# Patient Record
Sex: Female | Born: 1985 | Race: White | Marital: Married | Smoking: Never smoker
Health system: Southern US, Community
[De-identification: ages and names within clinical notes are randomized; demographics above are authoritative.]

## PROBLEM LIST (undated history)

## (undated) DIAGNOSIS — F32A Depression, unspecified: Secondary | ICD-10-CM

## (undated) DIAGNOSIS — T7840XA Allergy, unspecified, initial encounter: Secondary | ICD-10-CM

## (undated) DIAGNOSIS — R569 Unspecified convulsions: Secondary | ICD-10-CM

## (undated) DIAGNOSIS — J45909 Unspecified asthma, uncomplicated: Secondary | ICD-10-CM

## (undated) DIAGNOSIS — F419 Anxiety disorder, unspecified: Secondary | ICD-10-CM

## (undated) HISTORY — PX: HIP SURGERY: SHX245

## (undated) HISTORY — DX: Anxiety disorder, unspecified: F41.9

## (undated) HISTORY — DX: Unspecified convulsions: R56.9

## (undated) HISTORY — DX: Unspecified asthma, uncomplicated: J45.909

## (undated) HISTORY — PX: OVARIAN CYST REMOVAL: SHX89

## (undated) HISTORY — DX: Depression, unspecified: F32.A

## (undated) HISTORY — DX: Allergy, unspecified, initial encounter: T78.40XA

---

## 2012-09-05 ENCOUNTER — Other Ambulatory Visit: Payer: Self-pay | Admitting: Nurse Practitioner

## 2012-09-05 DIAGNOSIS — N949 Unspecified condition associated with female genital organs and menstrual cycle: Secondary | ICD-10-CM

## 2012-09-05 DIAGNOSIS — R102 Pelvic and perineal pain: Secondary | ICD-10-CM

## 2012-09-08 ENCOUNTER — Ambulatory Visit
Admission: RE | Admit: 2012-09-08 | Discharge: 2012-09-08 | Disposition: A | Discharge status: Home / Self Care | Payer: BC Managed Care – PPO | Source: Ambulatory Visit | Attending: Nurse Practitioner | Admitting: Nurse Practitioner

## 2012-09-08 DIAGNOSIS — N949 Unspecified condition associated with female genital organs and menstrual cycle: Secondary | ICD-10-CM

## 2012-09-08 DIAGNOSIS — R102 Pelvic and perineal pain: Secondary | ICD-10-CM

## 2013-02-10 DIAGNOSIS — R634 Abnormal weight loss: Secondary | ICD-10-CM

## 2013-02-10 HISTORY — DX: Abnormal weight loss: R63.4

## 2014-08-02 DIAGNOSIS — G43901 Migraine, unspecified, not intractable, with status migrainosus: Secondary | ICD-10-CM | POA: Insufficient documentation

## 2014-08-02 DIAGNOSIS — K219 Gastro-esophageal reflux disease without esophagitis: Secondary | ICD-10-CM | POA: Insufficient documentation

## 2015-01-21 DIAGNOSIS — Z79899 Other long term (current) drug therapy: Secondary | ICD-10-CM | POA: Insufficient documentation

## 2015-01-21 HISTORY — DX: Other long term (current) drug therapy: Z79.899

## 2015-05-08 DIAGNOSIS — F325 Major depressive disorder, single episode, in full remission: Secondary | ICD-10-CM | POA: Insufficient documentation

## 2015-05-08 DIAGNOSIS — F40298 Other specified phobia: Secondary | ICD-10-CM | POA: Insufficient documentation

## 2015-05-08 DIAGNOSIS — F411 Generalized anxiety disorder: Secondary | ICD-10-CM

## 2015-05-08 HISTORY — DX: Generalized anxiety disorder: F41.1

## 2015-05-08 HISTORY — DX: Major depressive disorder, single episode, in full remission: F32.5

## 2016-10-06 DIAGNOSIS — Z975 Presence of (intrauterine) contraceptive device: Secondary | ICD-10-CM | POA: Diagnosis not present

## 2016-10-06 DIAGNOSIS — N938 Other specified abnormal uterine and vaginal bleeding: Secondary | ICD-10-CM | POA: Diagnosis not present

## 2016-11-03 DIAGNOSIS — F411 Generalized anxiety disorder: Secondary | ICD-10-CM | POA: Diagnosis not present

## 2016-11-03 DIAGNOSIS — F3341 Major depressive disorder, recurrent, in partial remission: Secondary | ICD-10-CM | POA: Diagnosis not present

## 2016-11-03 DIAGNOSIS — F40298 Other specified phobia: Secondary | ICD-10-CM | POA: Diagnosis not present

## 2016-11-04 DIAGNOSIS — S61213A Laceration without foreign body of left middle finger without damage to nail, initial encounter: Secondary | ICD-10-CM | POA: Diagnosis not present

## 2016-12-02 DIAGNOSIS — Z01419 Encounter for gynecological examination (general) (routine) without abnormal findings: Secondary | ICD-10-CM | POA: Diagnosis not present

## 2016-12-02 DIAGNOSIS — N921 Excessive and frequent menstruation with irregular cycle: Secondary | ICD-10-CM | POA: Diagnosis not present

## 2016-12-02 DIAGNOSIS — Z6825 Body mass index (BMI) 25.0-25.9, adult: Secondary | ICD-10-CM | POA: Diagnosis not present

## 2016-12-15 DIAGNOSIS — F40298 Other specified phobia: Secondary | ICD-10-CM | POA: Diagnosis not present

## 2016-12-15 DIAGNOSIS — F3341 Major depressive disorder, recurrent, in partial remission: Secondary | ICD-10-CM | POA: Diagnosis not present

## 2016-12-15 DIAGNOSIS — F411 Generalized anxiety disorder: Secondary | ICD-10-CM | POA: Diagnosis not present

## 2017-03-15 DIAGNOSIS — S63642A Sprain of metacarpophalangeal joint of left thumb, initial encounter: Secondary | ICD-10-CM | POA: Diagnosis not present

## 2017-03-30 DIAGNOSIS — M25551 Pain in right hip: Secondary | ICD-10-CM | POA: Diagnosis not present

## 2017-04-21 NOTE — Progress Notes (Signed)
[name] [address] [place], [place] [address] Phone: [phone] Subjective:    I'm seeing this patient by the request  of:    CC: Hip pain  HWK:[id number]  Tammy Cunningham is a 31 y.o. female coming in with complaint of hip pain. Right-sided. Patient does do Brother therapy. Fell on the sign. Patient did do a lot of other things that unfortunately continued to have worsening pain. Seems to start on the posterior aspect andaround laterally to bit anterior. Seems to be more on the anterior aspect now.States that she has a discomfort at all times.Sometimes has a mild spasming going down the leg. Denies any associated back pain. Patient attempted to try to roller skate but unfortunately found it to start causing worsening pain.Rates the severity of pain a 6 out of 10 and not improving.Does respond somewhat to anti-inflammatories.    No past medical history on file. No past surgical history on file. Social History   Social History  . Marital status: Single    Spouse name: N/A  . Number of children: N/A  . Years of education: N/A   Social History Main Topics  . Smoking status: Never Smoker  . Smokeless tobacco: Never Used  . Alcohol use None  . Drug use: Unknown  . Sexual activity: Not Asked   Other Topics Concern  . None   Social History Narrative  . None   Allergies not on file No family history on file.   Past medical history, social, surgical and family history all reviewed in electronic medical record.  No pertanent information unless stated regarding to the chief complaint.   Review of Systems:Review of systems updated and as accurate as of 04/22/17  No headache, visual changes, nausea, vomiting, diarrhea, constipation, dizziness, abdominal pain, skin rash, fevers, chills, night sweats, weight loss, swollen lymph nodes, body aches, joint swelling, muscle aches, chest pain, shortness of breath, mood changes.   Objective  Blood pressure  116/82, pulse 74, height 5\' 8"  (1.727 m), weight 136 lb (61.7 kg), SpO2 99 %. Systems examined below as of 04/22/17   General: No apparent distress alert and oriented x3 mood and affect normal, dressed appropriately.  HEENT: Pupils equal, extraocular movements intact  Respiratory: Patient's speak in full sentences and does not appear short of breath  Cardiovascular: No lower extremity edema, non tender, no erythema  Skin: Warm dry intact with no signs of infection or rash on extremities or on axial skeleton.  Abdomen: Soft nontender  Neuro: Cranial nerves II through XII are intact, neurovascularly intact in all extremities with 2+ DTRs and 2+ pulses.  Lymph: No lymphadenopathy of posterior or anterior cervical chain or axillae bilaterally.  Gait normal with good balance and coordination.  MSK:  Non tender with full range of motion and good stability and symmetric strength and tone of shoulders, elbows, wrist, knee and ankles bilaterally.  YVO:PFYTW ROM IR: 25 Deg with worsening pain ER: 45 Deg, Flexion: 120 Deg, Extension: 100 Deg, Abduction: 45 Deg, Adduction: 20 Deg Strength IR: 5/5, ER: 5/5, Flexion: 5/5, Extension: 5/5, Abduction: 5/5, Adduction: 5/5 Pelvic alignment unremarkable to inspection and palpation. Standing hip rotation and gait without trendelenburg sign / unsteadiness. Greater trochanter without tenderness to palpation. No tenderness over piriformis and greater trochanter. Pain with Pearlean Brownie as well as internal rotation but seems to be on the anterior lateral aspect of thehipPain with positive grind test No SI joint tenderness and normal minimal SI movement. Negative straight leg  test but any type offlexion against resistance of the hip causes a spasm of the quadricep.  Procedure note 97110; 15 additional minutes spent for Therapeutic exercises as stated in above notes.  This included exercises focusing on stretching, strengthening, with significant focus on eccentric aspects.    Long term goals include an improvement in range of motion, strength, endurance as well as avoiding reinjury. Patient's frequency would include in 1-2 times a day, 3-5 times a week for a duration of 6-12 weeks. Hip strengthening exercises which included:  Pelvic tilt/bracing to help with proper recruitment of the lower abs and pelvic floor muscles  Glute strengthening to properly contract glutes without over-engaging low back and hamstrings - prone hip extension and glute bridge exercises Proper stretching techniques to increase effectiveness for the hip flexors, groin, quads, piriformic and low back when appropriate     Proper technique shown and discussed handout in great detail with ATC.  All questions were discussed and answered.     Impression and Recommendations:     This case required medical decision making of moderate complexity.      Note: This dictation was prepared with Dragon dictation along with smaller phrase technology. Any transcriptional errors that result from this process are unintentional.

## 2017-04-22 ENCOUNTER — Ambulatory Visit (INDEPENDENT_AMBULATORY_CARE_PROVIDER_SITE_OTHER)
Admission: RE | Admit: 2017-04-22 | Discharge: 2017-04-22 | Disposition: A | Discharge status: Home / Self Care | Payer: BLUE CROSS/BLUE SHIELD | Source: Ambulatory Visit | Attending: Family Medicine | Admitting: Family Medicine

## 2017-04-22 ENCOUNTER — Ambulatory Visit (INDEPENDENT_AMBULATORY_CARE_PROVIDER_SITE_OTHER): Payer: BLUE CROSS/BLUE SHIELD | Admitting: Family Medicine

## 2017-04-22 ENCOUNTER — Encounter: Payer: Self-pay | Admitting: Family Medicine

## 2017-04-22 VITALS — BP 116/82 | HR 74 | Ht 68.0 in | Wt 136.0 lb

## 2017-04-22 DIAGNOSIS — M25551 Pain in right hip: Secondary | ICD-10-CM | POA: Diagnosis not present

## 2017-04-22 DIAGNOSIS — S79911A Unspecified injury of right hip, initial encounter: Secondary | ICD-10-CM | POA: Diagnosis not present

## 2017-04-22 MED ORDER — VITAMIN D (ERGOCALCIFEROL) 1.25 MG (50000 UNIT) PO CAPS: 50000.0000 [IU] | ORAL_CAPSULE | ORAL | 0 refills | Status: DC

## 2017-04-22 MED ORDER — MELOXICAM 15 MG PO TABS: 15.0000 mg | ORAL_TABLET | Freq: Every day | ORAL | 0 refills | Status: DC

## 2017-04-22 NOTE — Assessment & Plan Note (Signed)
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 This could be secondary to his labral pathology potential loose body. Patient is able to move but does have difficulty.Patient hasx-rays ordered today, meloxicam for breakthrough pain, once weekly vitamin D to help with the differential a possible stress fracture. Encourage patient to limit certain activities such as contact. Patient given home exercises. Follow-up againin 3-4 weeks.

## 2017-04-22 NOTE — Patient Instructions (Addendum)
Good to see you  Ice 20 minutes 2 times daily. Usually after activity and before bed. Exercises 3 times a week.  Thigh compression sleeve daily for 2 weeks No contact for 2 weeks.  once weekly vitamin D for 12 weeks.  Meloxicam daily for 10 days then as needed See me again in 3 weeks.

## 2017-05-06 ENCOUNTER — Ambulatory Visit (INDEPENDENT_AMBULATORY_CARE_PROVIDER_SITE_OTHER): Payer: BLUE CROSS/BLUE SHIELD | Admitting: Family Medicine

## 2017-05-06 ENCOUNTER — Encounter: Payer: Self-pay | Admitting: Family Medicine

## 2017-05-06 DIAGNOSIS — M25551 Pain in right hip: Secondary | ICD-10-CM | POA: Diagnosis not present

## 2017-05-06 MED ORDER — NAPROXEN 500 MG PO TABS: 500.0000 mg | ORAL_TABLET | Freq: Two times a day (BID) | ORAL | 3 refills | Status: AC

## 2017-05-06 NOTE — Progress Notes (Signed)
  [facility] Marybella [facility] D.[facility] [address] [place], [place] [address] Phone: [phone] Subjective:    I'm seeing this patient by the request  of:    CC: Hip pain f/u  FAO:[id number]:Subjective  Tammy JacobKatie Cunningham is a 31 y.o. female coming in with complaint of hip pain. Right-sided. .Labral tear. Length in trying conservative therapy. Patient states unfortunately having worsening pain. States that pain is worse than at night. Having increasing instability. Did have x-rays at last follow-up that were completely unremarkable. These were evaluated by me and independently reviewed by me. Patient states though that unable to do many activities. Seems to be worse when she loads the hip.  No past medical history on file. No past surgical history on file. Social History   Social History  . Marital status: Single    Spouse name: N/A  . Number of children: N/A  . Years of education: N/A   Social History Main Topics  . Smoking status: Never Smoker  . Smokeless tobacco: Never Used  . Alcohol use None  . Drug use: Unknown  . Sexual activity: Not Asked   Other Topics Concern  . None   Social History Narrative  . None   Not on File No family history on file. No family history of autoimmune diseases   Past medical history, social, surgical and family history all reviewed in electronic medical record.  No pertanent information unless stated regarding to the chief complaint.   R.zsys   Objective  Blood pressure 118/80, pulse 78, height 5\' 8"  (1.727 m), weight 146 lb (66.2 kg), SpO2 97 %.   Systems examined below as of 05/06/17 General: NAD A&O x3 mood, affect normal  HEENT: Pupils equal, extraocular movements intact no nystagmus Respiratory: not short of breath at rest or with speaking Cardiovascular: No lower extremity edema, non tender Skin: Warm dry intact with no signs of infection or rash on extremities or on axial skeleton. Abdomen: Soft nontender, no masses Neuro: Cranial  nerves  intact, neurovascularly intact in all extremities with 2+ DTRs and 2+ pulses. Lymph: No lymphadenopathy appreciated today  Gait normal with good balance and coordination.  MSK: Non tender with full range of motion and good stability and symmetric strength and tone of shoulders, elbows, wrist,  knee and ankles bilaterally.     Hip: Right ROM IR: 15 Deg with worsening pain, ER: 45 Deg, Flexion: 120 Deg, Extension: 100 Deg, Abduction: 45 Deg, Adduction: 5 Deg with worsening pain Strength IR: 5/5, ER: 5/5, Flexion: 5/5, Extension: 5/5, Abduction: 4/5, Adduction: 4/5 Pelvic alignment unremarkable to inspection and palpation. Standing hip rotation and gait without trendelenburg sign / unsteadiness. Worsening pain with internal rotation. Nontender over the greater trochanteric area of No SI joint tenderness and normal minimal SI movement. Contralateral hip unremarkable    Impression and Recommendations:     This case required medical decision making of moderate complexity.      Note: This dictation was prepared with Dragon dictation along with smaller phrase technology. Any transcriptional errors that result from this process are unintentional.

## 2017-05-06 NOTE — Assessment & Plan Note (Signed)
Patient has had this pain for quite some time. Worsening symptoms Positive grind test and positive pain with internal rotation. Patient is having is starting to limit that even daily activities. Limited her job as well. Failed all conservative therapy and I do feel that advance imaging is warranted. Labral pathology is a possibility MR angiogram will be ordered today. Depending on findings this could change medical management significantly. Patient is in agreement with plan.

## 2017-05-06 NOTE — Patient Instructions (Addendum)
Good to see you  Tammy Cunningham is your friend.  Naproxen 2 times a day instead Continue the vitamin D We will get the MRI.  I will write you and discuss next steps

## 2017-05-07 ENCOUNTER — Encounter: Payer: Self-pay | Admitting: Family Medicine

## 2017-05-07 ENCOUNTER — Other Ambulatory Visit: Payer: Self-pay

## 2017-05-07 DIAGNOSIS — M25551 Pain in right hip: Secondary | ICD-10-CM

## 2017-05-07 MED ORDER — PREDNISONE 50 MG PO TABS: 50.0000 mg | ORAL_TABLET | Freq: Every day | ORAL | 0 refills | Status: DC

## 2017-05-13 ENCOUNTER — Ambulatory Visit: Payer: BLUE CROSS/BLUE SHIELD | Admitting: Family Medicine

## 2017-05-18 ENCOUNTER — Ambulatory Visit
Admission: RE | Admit: 2017-05-18 | Discharge: 2017-05-18 | Disposition: A | Discharge status: Home / Self Care | Payer: BLUE CROSS/BLUE SHIELD | Source: Ambulatory Visit | Attending: Family Medicine | Admitting: Family Medicine

## 2017-05-18 DIAGNOSIS — M25551 Pain in right hip: Secondary | ICD-10-CM

## 2017-05-18 DIAGNOSIS — S73191A Other sprain of right hip, initial encounter: Secondary | ICD-10-CM | POA: Diagnosis not present

## 2017-05-18 MED ORDER — IOPAMIDOL (ISOVUE-M 200) INJECTION 41%
12.0000 mL | Freq: Once | INTRAMUSCULAR | Status: AC
  Administered 2017-05-18: 12 mL via INTRA_ARTICULAR

## 2017-05-19 ENCOUNTER — Encounter: Payer: Self-pay | Admitting: Family Medicine

## 2017-05-25 ENCOUNTER — Ambulatory Visit: Payer: Self-pay

## 2017-05-25 ENCOUNTER — Encounter: Payer: Self-pay | Admitting: Family Medicine

## 2017-05-25 ENCOUNTER — Ambulatory Visit (INDEPENDENT_AMBULATORY_CARE_PROVIDER_SITE_OTHER): Payer: BLUE CROSS/BLUE SHIELD | Admitting: Family Medicine

## 2017-05-25 VITALS — BP 100/70 | HR 76 | Ht 67.0 in | Wt 150.0 lb

## 2017-05-25 DIAGNOSIS — M25551 Pain in right hip: Secondary | ICD-10-CM | POA: Diagnosis not present

## 2017-05-25 DIAGNOSIS — S73191A Other sprain of right hip, initial encounter: Secondary | ICD-10-CM

## 2017-05-25 NOTE — Progress Notes (Signed)
[name] [address] [place], [place] [address] Phone: [phone] Subjective:     CC: Right hip pain  WGN:[id number]  Tammy Cunningham is a 31 y.o. female coming in with complaint of right hip pain. Was having pain and was seeming to be intra-articular. Sent for a MRI arthrogram. Independently visualized by me showing a labral tear. Patient is going to likely have surgical intervention but wants to consider in 2-3 months. Pain is unrelenting at that time and is starting to stop her from daily activities. Looking for some other type of intervention at this time to help her to continue at least been able to work     No past medical history on file. No past surgical history on file. Social History   Social History  . Marital status: Single    Spouse name: N/A  . Number of children: N/A  . Years of education: N/A   Social History Main Topics  . Smoking status: Never Smoker  . Smokeless tobacco: Never Used  . Alcohol use Not on file  . Drug use: Unknown  . Sexual activity: Not on file   Other Topics Concern  . Not on file   Social History Narrative  . No narrative on file   Allergies  Allergen Reactions  . Peanut Oil Anaphylaxis  . Amoxicillin Hives  . Depakote Er  [Divalproex Sodium Er] Other (See Comments)    Liver failure  . Lac Bovis Other (See Comments)    Breathing problems  . Onion Other (See Comments)    breathing  . Paroxetine Other (See Comments)    nausea  . Penicillins Hives  . Eggs Or Egg-Derived Products Other (See Comments)   No family history on file.   Past medical history, social, surgical and family history all reviewed in electronic medical record.  No pertanent information unless stated regarding to the chief complaint.   Review of Systems:Review of systems updated and as accurate as of 05/25/17  No headache, visual changes, nausea, vomiting, diarrhea, constipation, dizziness, abdominal pain, skin rash, fevers,  chills, night sweats, weight loss, swollen lymph nodes, body aches, joint swelling, muscle aches, chest pain, shortness of breath, mood changes.   Objective  Blood pressure (!) 124/56. Systems examined below as of 05/25/17   General: No apparent distress alert and oriented x3 mood and affect normal, dressed appropriately.  HEENT: Pupils equal, extraocular movements intact  Respiratory: Patient's speak in full sentences and does not appear short of breath  Cardiovascular: No lower extremity edema, non tender, no erythema  Skin: Warm dry intact with no signs of infection or rash on extremities or on axial skeleton.  Abdomen: Soft nontender  Neuro: Cranial nerves II through XII are intact, neurovascularly intact in all extremities with 2+ DTRs and 2+ pulses.  Lymph: No lymphadenopathy of posterior or anterior cervical chain or axillae bilaterally.  Gait normal with good balance and coordination.  MSK:  Non tender with full range of motion and good stability and symmetric strength and tone of shoulders, elbows, wrist, knee and ankles bilaterally.   Hip: Right ROM IR: 25 Deg pain with internal range of motion, ER: 45 Deg, Flexion: 120 Deg, Extension: 100 Deg, Abduction: 45 Deg, Adduction: 45 Deg Strength IR: 5/5, ER: 5/5, Flexion: 5/5, Extension: 5/5, Abduction: 5/5, Adduction: 5/5 Pelvic alignment unremarkable to inspection and palpation. Standing hip rotation and gait without trendelenburg sign / unsteadiness. Greater trochanter without tenderness to palpation. No tenderness over piriformis and  greater trochanter. Positive pain with flexion, adduction, and internal rotation of the hip No SI joint tenderness and normal minimal SI movement.    Procedure: Real-time Ultrasound Guided Injection of right hip Device: GE Logiq Q7  Ultrasound guided injection is preferred based studies that show increased duration, increased effect, greater accuracy, decreased procedural pain, increased response  rate with ultrasound guided versus blind injection.  Verbal informed consent obtained.  Time-out conducted.  Noted no overlying erythema, induration, or other signs of local infection.  Skin prepped in a sterile fashion.  Local anesthesia: Topical Ethyl chloride.  With sterile technique and under real time ultrasound guidance:  Anterior capsule visualized, needle visualized going to the head neck junction at the anterior capsule. Pictures taken. Patient did have injection of  3 cc of 0.5% Marcaine, and 1 cc of Kenalog 40 mg/dL. Completed without difficulty  Pain immediately resolved suggesting accurate placement of the medication.  Advised to call if fevers/chills, erythema, induration, drainage, or persistent bleeding.  Images permanently stored and available for review in the ultrasound unit.  Impression: Technically successful ultrasound guided injection. Impression and Recommendations:     This case required medical decision making of moderate complexity.      Note: This dictation was prepared with Dragon dictation along with smaller phrase technology. Any transcriptional errors that result from this process are unintentional.

## 2017-05-25 NOTE — Assessment & Plan Note (Signed)
Patient was given an injection. This will hopefully help for a while and will also be diagnostic. Patient will be referred to orthopedic surgery to discuss the possibility of surgical intervention for the labral tear. Asian has different medications for breakthrough pain needed. Patient call if any worsening symptoms. Patient will follow-up with me again in 4-8 weeks otherwise. Continue home exercises. Avoid high impact exercises.

## 2017-05-25 NOTE — Patient Instructions (Signed)
Good to see you  Ice is your friend We will get you in with the surgeon soon.  Call me if you need me

## 2017-05-26 ENCOUNTER — Encounter: Payer: Self-pay | Admitting: Family Medicine

## 2017-07-27 ENCOUNTER — Encounter: Payer: Self-pay | Admitting: Family Medicine

## 2017-07-28 ENCOUNTER — Encounter: Payer: Self-pay | Admitting: Family Medicine

## 2017-08-18 NOTE — Progress Notes (Signed)
Tammy Cunningham, [place] [id number] Phone: [phone] Subjective:     CC: Right hip pain follow-up  BJY:[id number]:Subjective  Tammy Cunningham is a 31 y.o. female coming in with complaint of right hip pain follow-up.  Patient has a known labral tear.  Patient is scheduled for surgery next month.  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  Has responded well to the injection previously.  Looking to potentially have another one so she can get better before her surgery.     History reviewed. No pertinent past medical history. History reviewed. No pertinent surgical history. Social History   Socioeconomic History  . Marital status: Single    Spouse name: None  . Number of children: None  . Years of education: None  . Highest education level: None  Social Needs  . Financial resource strain: None  . Food insecurity - worry: None  . Food insecurity - inability: None  . Transportation needs - medical: None  . Transportation needs - non-medical: None  Occupational History  . None  Tobacco Use  . Smoking status: Never Smoker  . Smokeless tobacco: Never Used  Substance and Sexual Activity  . Alcohol use: None  . Drug use: None  . Sexual activity: None  Other Topics Concern  . None  Social History Narrative  . None   Allergies  Allergen Reactions  . Peanut Oil Anaphylaxis  . Amoxicillin Hives  . Depakote Er  [Divalproex Sodium Er] Other (See Comments)    Liver failure  . Lac Bovis Other (See Comments)    Breathing problems  . Onion Other (See Comments)    breathing  . Paroxetine Other (See Comments)    nausea  . Penicillins Hives  . Eggs Or Egg-Derived Products Other (See Comments)   History reviewed. No pertinent family history.   Past medical history, social, surgical and family history all reviewed in electronic medical record.  No pertanent information unless stated regarding to the chief complaint.    Review of Systems:Review of systems updated and as accurate as of 08/19/17  No headache, visual changes, nausea, vomiting, diarrhea, constipation, dizziness, abdominal pain, skin rash, fevers, chills, night sweats, weight loss, swollen lymph nodes, body aches, joint swelling, chest pain, shortness of breath, mood changes.  Positive muscle aches  Objective  Blood pressure 110/80, pulse 79, height 5\' 7"  (1.702 m), weight 150 lb (68 kg), SpO2 98 %. Systems examined below as of 08/19/17   General: No apparent distress alert and oriented x3 mood and affect normal, dressed appropriately.  HEENT: Pupils equal, extraocular movements intact  Respiratory: Patient's speak in full sentences and does not appear short of breath  Cardiovascular: No lower extremity edema, non tender, no erythema  Skin: Warm dry intact with no signs of infection or rash on extremities or on axial skeleton.  Abdomen: Soft nontender  Neuro: Cranial nerves II through XII are intact, neurovascularly intact in all extremities with 2+ DTRs and 2+ pulses.  Lymph: No lymphadenopathy of posterior or anterior cervical chain or axillae bilaterally.  Gait normal with good balance and coordination.  MSK:  Non tender with full range of motion and good stability and symmetric strength and tone of shoulders, elbows, wrist, knee and ankles bilaterally.  Hip: Right ROM IR: 15 Deg increased pain, ER: 45 Deg, Flexion: 120 Deg, Extension: 100 Deg, Abduction: 45 Deg, Adduction: 45 Deg Strength IR: 5/5, ER: 5/5, Flexion: 5/5, Extension:  5/5, Abduction: 5/5, Adduction: 5/5 Pelvic alignment unremarkable to inspection and palpation. Standing hip rotation and gait without trendelenburg sign / unsteadiness. Greater trochanter without tenderness to palpation. No tenderness over piriformis and greater trochanter. Positive Faber and increased pain with internal rotation No SI joint tenderness and normal minimal SI movement. Contralateral hip  unremarkable  Procedure: Real-time Ultrasound Guided Injection of right hip Device: GE Logiq Q7  Ultrasound guided injection is preferred based studies that show increased duration, increased effect, greater accuracy, decreased procedural pain, increased response rate with ultrasound guided versus blind injection.  Verbal informed consent obtained.  Time-out conducted.  Noted no overlying erythema, induration, or other signs of local infection.  Skin prepped in a sterile fashion.  Local anesthesia: Topical Ethyl chloride.  With sterile technique and under real time ultrasound guidance:  Anterior capsule visualized, needle visualized going to the head neck junction at the anterior capsule. Pictures taken. Patient did have injection of  3 cc of 0.5% Marcaine, and 1 cc of Kenalog 40 mg/dL. Completed without difficulty  Pain immediately resolved suggesting accurate placement of the medication.  Advised to call if fevers/chills, erythema, induration, drainage, or persistent bleeding.  Images permanently stored and available for review in the ultrasound unit.  Impression: Technically successful ultrasound guided injection.   Impression and Recommendations:     This case required medical decision making of moderate complexity.      Note: This dictation was prepared with Dragon dictation along with smaller phrase technology. Any transcriptional errors that result from this process are unintentional.

## 2017-08-19 ENCOUNTER — Encounter: Payer: Self-pay | Admitting: Family Medicine

## 2017-08-19 ENCOUNTER — Ambulatory Visit: Payer: BC Managed Care – PPO | Admitting: Family Medicine

## 2017-08-19 ENCOUNTER — Ambulatory Visit: Payer: Self-pay

## 2017-08-19 VITALS — BP 110/80 | HR 79 | Ht 67.0 in | Wt 150.0 lb

## 2017-08-19 DIAGNOSIS — S73191D Other sprain of right hip, subsequent encounter: Secondary | ICD-10-CM | POA: Diagnosis not present

## 2017-08-19 DIAGNOSIS — M25559 Pain in unspecified hip: Secondary | ICD-10-CM

## 2017-08-19 NOTE — Patient Instructions (Signed)
Happy holidays!  Ice may be needed in 6 hours.  Have a great new year and you will do great with surgery  Keep me updated  I am here if you have questions.

## 2017-08-19 NOTE — Assessment & Plan Note (Signed)
Worsening pain again.  Patient is scheduled for surgery in the near future.  Given injection to help her until that date.  We discussed icing regimen, home exercises, patient has some pain management with over-the-counter medications as well.  Follow-up again as needed.

## 2017-09-14 ENCOUNTER — Encounter: Payer: Self-pay | Admitting: Family Medicine

## 2019-09-05 DIAGNOSIS — Z91010 Allergy to peanuts: Secondary | ICD-10-CM | POA: Insufficient documentation

## 2019-11-30 DIAGNOSIS — F988 Other specified behavioral and emotional disorders with onset usually occurring in childhood and adolescence: Secondary | ICD-10-CM | POA: Insufficient documentation

## 2020-09-05 DIAGNOSIS — Z13220 Encounter for screening for lipoid disorders: Secondary | ICD-10-CM | POA: Insufficient documentation

## 2020-09-05 DIAGNOSIS — Z7689 Persons encountering health services in other specified circumstances: Secondary | ICD-10-CM | POA: Insufficient documentation

## 2020-09-05 HISTORY — DX: Encounter for screening for lipoid disorders: Z13.220

## 2021-05-27 DIAGNOSIS — M542 Cervicalgia: Secondary | ICD-10-CM

## 2021-05-27 HISTORY — DX: Cervicalgia: M54.2

## 2021-06-11 ENCOUNTER — Other Ambulatory Visit: Payer: Self-pay

## 2021-06-11 ENCOUNTER — Encounter: Payer: Self-pay | Admitting: Family Medicine

## 2021-06-11 ENCOUNTER — Telehealth: Payer: Self-pay | Admitting: Family Medicine

## 2021-06-11 ENCOUNTER — Ambulatory Visit (INDEPENDENT_AMBULATORY_CARE_PROVIDER_SITE_OTHER): Payer: 59 | Admitting: Family Medicine

## 2021-06-11 ENCOUNTER — Ambulatory Visit: Payer: Self-pay

## 2021-06-11 ENCOUNTER — Ambulatory Visit (INDEPENDENT_AMBULATORY_CARE_PROVIDER_SITE_OTHER): Payer: 59

## 2021-06-11 VITALS — BP 110/70 | HR 90 | Ht 67.0 in | Wt 148.0 lb

## 2021-06-11 DIAGNOSIS — S73191D Other sprain of right hip, subsequent encounter: Secondary | ICD-10-CM

## 2021-06-11 DIAGNOSIS — M25559 Pain in unspecified hip: Secondary | ICD-10-CM

## 2021-06-11 DIAGNOSIS — M25551 Pain in right hip: Secondary | ICD-10-CM

## 2021-06-11 MED ORDER — MELOXICAM 15 MG PO TABS: 15.0000 mg | ORAL_TABLET | Freq: Every day | ORAL | 0 refills | Status: DC

## 2021-06-11 MED ORDER — TIZANIDINE HCL 2 MG PO TABS: 2.0000 mg | ORAL_TABLET | Freq: Every day | ORAL | 0 refills | Status: DC

## 2021-06-11 NOTE — Progress Notes (Signed)
[name] [address] [place] [address] Phone: [phone] Subjective:   INadine Counts, am serving as a scribe for Dr. Antoine Primas. This visit occurred during the SARS-CoV-2 public health emergency.  Safety protocols were in place, including screening questions prior to the visit, additional usage of staff PPE, and extensive cleaning of exam room while observing appropriate contact time as indicated for disinfecting solutions.   I'm seeing this patient by the request  of:  Tammy Baldy, NP  CC: Hip pain  [id number]  Seen Dec 2018 for right hip pain  Worsening pain again.  Patient is scheduled for surgery in the near future.  Given injection to help her until that date.  We discussed icing regimen, home exercises, patient has some pain management with over-the-counter medications as well.  Follow-up again as needed.  Updated 06/11/2021 Tammy Cunningham is a 35 y.o. female coming in with complaint of hip pain. Monday she was pushing some bleachers and felt a sharp pain in her right hip. She said it was a similar pain and has similar pains in that hip when she tore her labrum years ago. She does feel a clicking at the end of every stride and has some low back tightness       No past medical history on file. No past surgical history on file. Social History   Socioeconomic History   Marital status: Single    Spouse name: Not on file   Number of children: Not on file   Years of education: Not on file   Highest education level: Not on file  Occupational History   Not on file  Tobacco Use   Smoking status: Never   Smokeless tobacco: Never  Substance and Sexual Activity   Alcohol use: Not on file   Drug use: Not on file   Sexual activity: Not on file  Other Topics Concern   Not on file  Social History Narrative   Not on file   Social Determinants of Health   Financial Resource Strain: Not on file  Food Insecurity: Not on file   Transportation Needs: Not on file  Physical Activity: Not on file  Stress: Not on file  Social Connections: Not on file   Allergies  Allergen Reactions   Peanut Oil Anaphylaxis   Amoxicillin Hives   Depakote Er  [Divalproex Sodium Er] Other (See Comments)    Liver failure   Lac Bovis Other (See Comments)    Breathing problems   Onion Other (See Comments)    breathing   Paroxetine Other (See Comments)    nausea   Penicillins Hives   Eggs Or Egg-Derived Products Other (See Comments)   No family history on file.  Current Outpatient Medications (Endocrine & Metabolic):    etonogestrel (NEXPLANON) 68 MG IMPL implant, Inject into the skin.   predniSONE (DELTASONE) 50 MG tablet, Take 1 tablet (50 mg total) by mouth daily.    Current Outpatient Medications (Analgesics):    meloxicam (MOBIC) 15 MG tablet, Take 1 tablet (15 mg total) by mouth daily.   ibuprofen (ADVIL,MOTRIN) 200 MG tablet, Take by mouth.   rizatriptan (MAXALT) 10 MG tablet, Take 10 mg by mouth as needed for migraine. May repeat in 2 hours if needed   Current Outpatient Medications (Other):    tiZANidine (ZANAFLEX) 2 MG tablet, Take 1 tablet (2 mg total) by mouth at bedtime.   ALPRAZolam (XANAX) 0.5 MG tablet, take 1 tablet by mouth  once daily if needed for EXTREME ANXIETY   lamoTRIgine (LAMICTAL) 100 MG tablet, take 1 tablet by mouth twice a day   Vitamin D, Ergocalciferol, (DRISDOL) 50000 units CAPS capsule, Take 1 capsule (50,000 Units total) by mouth every 7 (seven) days.   Reviewed prior external information including notes and imaging from  primary care provider As well as notes that were available from care everywhere and other healthcare systems.  Past medical history, social, surgical and family history all reviewed in electronic medical record.  No pertanent information unless stated regarding to the chief complaint.   Review of Systems:  No headache, visual changes, nausea, vomiting, diarrhea,  constipation, dizziness, abdominal pain, skin rash, fevers, chills, night sweats, weight loss, swollen lymph nodes, body aches, joint swelling, chest pain, shortness of breath, mood changes. POSITIVE muscle aches  Objective  Blood pressure 110/70, pulse 90, height 5\' 7"  (1.702 m), weight 148 lb (67.1 kg), SpO2 (!) 89 %.   General: No apparent distress alert and oriented x3 mood and affect normal, dressed appropriately.  HEENT: Pupils equal, extraocular movements intact  Respiratory: Patient's speak in full sentences and does not appear short of breath  Cardiovascular: No lower extremity edema, non tender, no erythema  Gait mild antalgic Right hip exam shows the patient does have some decrease in internal range of motion.  Severe positive grind test noted.  Patient has difficulty even with straight leg on the right side against resistance.  Mild pain in the lower back as well but very minimal.  Negative fulcrum test.  Limited muscular skeletal ultrasound was performed and interpreted by , M  Limited ultrasound shows the patient does have a trace effusion noted of the hip joint itself.  Patient does have potential postsurgical changes worse calcific changes of the anterior labrum that can be appreciated.  No cortical irregularity of the proximal femur or the femoral head Impression: Small effusion of the right    Impression and Recommendations:     The above documentation has been reviewed and is accurate and complete Antoine Primas, DO

## 2021-06-11 NOTE — Assessment & Plan Note (Signed)
Patient is having recurrent right hip pain.  4 years ago patient did have a labral tear.  Patient feels like it is very consistent with this again.  Patient does have significant positive impingement noted with a positive grind test noted.  On ultrasound does have inflammation.  Antalgic gait noted.  Affecting daily activities and waking up at night.  We will get x-rays at the moment but secondary to the feeling of instability I do feel that advanced imaging is warranted.  This is potentially affecting even her employment.  Follow-up with me again after imaging to discuss further.

## 2021-06-11 NOTE — Telephone Encounter (Signed)
Order that was put in yesterday for the Hip MRI should be a Hip Arthogram. Can you re-enter this order please?

## 2021-06-11 NOTE — Patient Instructions (Addendum)
Xray today Meloxicam 15mg  daily, no other anti-inflammatory medicines Zanaflex at night [facility] [phone] Call Today  When we receive your results we will contact you.

## 2021-06-12 NOTE — Addendum Note (Signed)
Addended by: Ethlyn Daniels on: 06/12/2021 09:36 AM   Modules accepted: Orders

## 2021-06-12 NOTE — Telephone Encounter (Signed)
Corrected

## 2021-06-26 ENCOUNTER — Other Ambulatory Visit: Payer: BC Managed Care – PPO

## 2021-07-08 ENCOUNTER — Ambulatory Visit
Admission: RE | Admit: 2021-07-08 | Discharge: 2021-07-08 | Disposition: A | Discharge status: Home / Self Care | Payer: 59 | Source: Ambulatory Visit | Attending: Family Medicine | Admitting: Family Medicine

## 2021-07-08 DIAGNOSIS — M25551 Pain in right hip: Secondary | ICD-10-CM

## 2021-07-08 DIAGNOSIS — S73191D Other sprain of right hip, subsequent encounter: Secondary | ICD-10-CM

## 2021-07-08 MED ORDER — IOPAMIDOL (ISOVUE-M 200) INJECTION 41%
15.0000 mL | Freq: Once | INTRAMUSCULAR | Status: AC
  Administered 2021-07-08: 15 mL via INTRA_ARTICULAR

## 2021-07-10 ENCOUNTER — Encounter: Payer: Self-pay | Admitting: Family Medicine

## 2021-07-15 ENCOUNTER — Other Ambulatory Visit: Payer: Self-pay

## 2021-07-15 DIAGNOSIS — S73191D Other sprain of right hip, subsequent encounter: Secondary | ICD-10-CM

## 2021-07-15 MED ORDER — TIZANIDINE HCL 2 MG PO TABS: 2.0000 mg | ORAL_TABLET | Freq: Every day | ORAL | 0 refills | Status: DC

## 2021-07-15 MED ORDER — MELOXICAM 15 MG PO TABS: 15.0000 mg | ORAL_TABLET | Freq: Every day | ORAL | 0 refills | Status: DC

## 2021-07-17 ENCOUNTER — Other Ambulatory Visit: Payer: Self-pay

## 2021-07-17 DIAGNOSIS — S73191D Other sprain of right hip, subsequent encounter: Secondary | ICD-10-CM

## 2021-07-22 ENCOUNTER — Ambulatory Visit: Payer: 59 | Attending: Family Medicine

## 2021-07-22 ENCOUNTER — Other Ambulatory Visit: Payer: Self-pay

## 2021-07-22 DIAGNOSIS — M6281 Muscle weakness (generalized): Secondary | ICD-10-CM | POA: Diagnosis present

## 2021-07-22 DIAGNOSIS — R262 Difficulty in walking, not elsewhere classified: Secondary | ICD-10-CM | POA: Insufficient documentation

## 2021-07-22 DIAGNOSIS — S73191D Other sprain of right hip, subsequent encounter: Secondary | ICD-10-CM | POA: Diagnosis present

## 2021-07-23 NOTE — Therapy (Signed)
[facility] [address] [place], [place], [address] Phone: [phone]   Fax:  [phone]  Physical Therapy Evaluation  Patient Details  Name: Ryanna Teschner MRN: [id number] Date of Birth: 04-03-1986 Referring Provider (PT): Judi Saa, DO   Encounter Date: 07/22/2021   PT End of Session - 07/22/21 0832     Visit Number 1    Number of Visits 7    Date for PT Re-Evaluation 09/13/21    Authorization Type [employer]    PT Start Time [phone]    PT Stop Time 0800    PT Time Calculation (min) 43 min    Activity Tolerance Patient tolerated treatment well    Behavior During Therapy [facility] for tasks assessed/performed             History reviewed. No pertinent past medical history.  History reviewed. No pertinent surgical history.  There were no vitals filed for this visit.    Subjective Assessment - 07/22/21 0726     Subjective Pt reports having a R hip labral tear 4 years ago, and tore it again approx 8 weeks ago pushing bleachers in at a recreation center. Pt notes having sharp pain with certain movements, and an ache with prolonged activity on feet. 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 Pt has a orthopedic appt for surgicak assessment in Jan 2023    Pertinent History Prior R labral 4 years ago    How long can you sit comfortably? Not an issue    How long can you stand comfortably? 5 mins, shifts wt off/on    How long can you walk comfortably? After 15 mins of walking it is sore when she stops    Diagnostic tests MRI- 07/09/21: IMPRESSION:  1. New right anterior labral tear.    Currently in Pain? Yes    Pain Score 3    range 0-6/10   Pain Location Hip    Pain Orientation Right    Pain Descriptors / Indicators Sharp;Aching    Pain Type Chronic pain    Pain Onset More than a month ago    Pain Frequency Intermittent    Aggravating Factors  certain movoements. standing, prolonged walking    Pain Relieving Factors rest                 [facility] PT Assessment - 07/23/21 0001       Assessment   Medical Diagnosis Tear of right acetabular labrum, subsequent encounter    Referring Provider (PT) Judi Saa, DO    Onset Date/Surgical Date --   approx 8 weeks ago   Next MD Visit Judi Saa, DO    Prior Therapy no      Precautions   Precautions None      Restrictions   Weight Bearing Restrictions Yes      Balance Screen   Has the patient fallen in the past 6 months Yes   playing roller derby   How many times? several    Has the patient had a decrease in activity level because of a fear of falling?  No    Is the patient reluctant to leave their home because of a fear of falling?  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      Cognition   Overall Cognitive Status Within Functional Limits for tasks assessed      Observation/Other Assessments   Focus on Therapeutic Outcomes (FOTO)  Complete on 2nd visit      Sensation   Light Touch Appears Intact      Posture/Postural Control   Posture/Postural Control No significant limitations      ROM / Strength   AROM / PROM / Strength PROM;Strength      PROM   Overall PROM Comments R hip PROM was equal to the L with pain reproduced at the endrange of hip Fadir movement      Strength   Strength Assessment Site Hip;Knee    Right/Left Hip Right;Left    Right Hip Flexion 4-/5    Right Hip Extension 4/5    Right Hip External Rotation  4+/5    Right Hip Internal Rotation 4+/5    Right Hip ABduction 4+/5    Right Hip ADduction 4+/5    Left Hip Flexion 5/5    Left Hip Extension 5/5    Left Hip External Rotation 5/5    Left Hip Internal Rotation 5/5    Left Hip ABduction 5/5    Left Hip ADduction 5/5     Right/Left Knee Right;Left    Right Knee Flexion 4+/5    Right Knee Extension 4+/5    Left Knee Flexion 5/5    Left Knee Extension 5/5      Special Tests    Special Tests Hip Special Tests    Other special tests Fadir R psotivie    Hip Special Tests  Hip Scouring      Hip Scouring   Findings Negative    Side Right      Transfers   Transfers Sit to Stand;Stand to Sit    Sit to Stand 7: Independent      Ambulation/Gait   Ambulation/Gait Yes    Ambulation/Gait Assistance 7: Independent    Gait Pattern Within Functional Limits;Step-through pattern   for distance less than 223ft                       Objective measurements completed on examination: See above findings.                PT Education - 07/22/21 0831     Education Details Eval findings, POC, HEP, positioning in sitting and sleep for comfort    Person(s) Educated Patient    Methods Explanation;Demonstration;Tactile cues;Verbal cues;Handout    Comprehension Verbalized understanding;Returned demonstration;Verbal cues required;Tactile cues required              PT Short Term Goals - 07/23/21 0744       PT SHORT TERM GOAL #1   Title STG=LTG               PT Long Term Goals - 07/23/21 0744       PT LONG TERM GOAL #1   Title Pt will be Ind in a HEP to maintain achieved LOF    Status New    Target Date 09/13/21      PT LONG TERM GOAL #2   Title Increase R hip strength and knee strength to 4+ to 5/5 and 5/5 respectively for improved functional movility both in quality and tolerance    Baseline see flow sheets    Status New    Target Date 09/13/21  PT LONG TERM GOAL #3   Title Increase pt's standing and walking tolerance to 45 mins each for improved functional mobility both in quality and tolerance.    Baseline Standing 5 mins and walking 15 mins    Status New    Target Date 09/13/21      PT LONG TERM GOAL #4   Title Decrease pt R hip pain to 3/10 or less and with a  decrease in frequency for improved function and QOL    Baseline 3-6/10    Status New    Target Date 09/13/21                    Plan - 07/22/21 0835     Clinical Impression Statement Pt presents with signs and symptoms consistent with a R hip labral tear. Pt most significant findings are R hip pain and decreased strength of the R LE. Pt will benefit from skilled PT to address strength and pain to optimize function and QOL.    Personal Factors and Comorbidities Past/Current Experience    Examination-Activity Limitations Locomotion Level;Squat;Stand    Stability/Clinical Decision Making Stable/Uncomplicated    Clinical Decision Making Low    Rehab Potential Good    PT Frequency 1x / week    PT Duration 6 weeks    PT Treatment/Interventions ADLs/Self Care Home Management;Cryotherapy;Electrical Stimulation;Iontophoresis 4mg /ml Dexamethasone;Moist Heat;Ultrasound;Gait training;Stair training;Neuromuscular re-education;Therapeutic exercise;Therapeutic activities;Functional mobility training;Balance training;Patient/family education;Manual techniques;Dry needling;Taping;Joint Manipulations    PT Next Visit Plan Assess response to HEP, progress ther ex as indicated, complete FOTO, assess 6 min walking test and set goal    PT Home Exercise Plan    Consulted and Agree with Plan of Care Patient             Patient will benefit from skilled therapeutic intervention in order to improve the following deficits and impairments:  Difficulty walking, Decreased activity tolerance, Pain, Decreased strength  Visit Diagnosis: Muscle weakness (generalized)  Tear of right acetabular labrum, subsequent encounter  Difficulty in walking, not elsewhere classified     Problem List Patient Active Problem List   Diagnosis Date Noted   Labral tear of right hip joint 05/25/2017   Right hip pain 04/22/2017    04/24/2017, PT 07/23/2021, 7:54 AM  [facility] [address] [place], [place], [place] Phone: [phone]   Fax:  [phone]  Name: Melanni Benway MRN: Hedy Jacob Date of Birth: [birth year]

## 2021-08-07 ENCOUNTER — Other Ambulatory Visit: Payer: Self-pay

## 2021-08-07 ENCOUNTER — Ambulatory Visit: Payer: 59 | Attending: Family Medicine

## 2021-08-07 DIAGNOSIS — M6281 Muscle weakness (generalized): Secondary | ICD-10-CM | POA: Diagnosis not present

## 2021-08-07 DIAGNOSIS — R262 Difficulty in walking, not elsewhere classified: Secondary | ICD-10-CM | POA: Diagnosis present

## 2021-08-07 DIAGNOSIS — S73191D Other sprain of right hip, subsequent encounter: Secondary | ICD-10-CM | POA: Diagnosis present

## 2021-08-07 NOTE — Therapy (Addendum)
[facility] [place], [place], [address] Phone: [phone]   Fax:  [phone]  Physical Therapy Treatment/Discharge  Patient Details  Name: Tammy Cunningham MRN: [id number] Date of Birth: 04-22-1986 Referring Provider (PT): Lyndal Pulley, DO   Encounter Date: 08/07/2021   PT End of Session - 08/07/21 0809     Visit Number 2    Number of Visits 7    Date for PT Re-Evaluation 09/13/21    Authorization Type [employer]    PT Start Time 0809    PT Stop Time 0845    PT Time Calculation (min) 36 min    Activity Tolerance Patient tolerated treatment well    Behavior During Therapy [facility] for tasks assessed/performed             History reviewed. No pertinent past medical history.  History reviewed. No pertinent surgical history.  There were no vitals filed for this visit.   Subjective Assessment - 08/07/21 0812     Subjective My R hip has been mostly good. Change in weather to lower temps increase pain. "I skated 1 roller derby practice and I tolerated it better than I thought. I was cautious with the pace." Pt notes education for positioning with sitting was beneficial.    Pertinent History Prior R labral 4 years ago    Currently in Pain? Yes    Pain Score 3     Pain Location Hip    Pain Orientation Right    Pain Descriptors / Indicators Aching;Sharp    Pain Type Chronic pain    Pain Onset More than a month ago    Pain Frequency Intermittent    Aggravating Factors  certain movoements. standing, prolonged walking    Pain Relieving Factors rest               [facility] Adult PT Treatment/Exercise:  Manual Therapy: - Axial distraction to the R hip supine for 20" on, 10" off   Therapeutic Exercise: - Bridging x10, 5 " - R SLR x10, 3 " - hip add set c ball x10, 5" - SL R hip abd x10, 3" - SL R clam c YTB, x10, 3" - prone R hip ext x10, 3" - SLS c 1 hand test, 1 min x 3  Neuromuscular  re-ed:   Therapeutic Activity:                                PT Education - 08/07/21 0835     Education Details Education for axial distraction of R hip at home using a strap and figure 8 hold at ankle. Updated HEP.    Person(s) Educated Patient    Methods Explanation;Demonstration;Tactile cues    Comprehension Verbalized understanding              PT Short Term Goals - 07/23/21 0744       PT SHORT TERM GOAL #1   Title STG=LTG               PT Long Term Goals - 07/23/21 0744       PT LONG TERM GOAL #1   Title Pt will be Ind in a HEP to maintain achieved LOF    Status New    Target Date 09/13/21      PT LONG TERM GOAL #2   Title Increase R hip strength and knee strength to 4+ to 5/5 and  5/5 respectively for improved functional movility both in quality and tolerance    Baseline see flow sheets    Status New    Target Date 09/13/21      PT LONG TERM GOAL #3   Title Increase pt's standing and walking tolerance to 45 mins each for improved functional mobility both in quality and tolerance.    Baseline Standing 5 mins and walking 15 mins    Status New    Target Date 09/13/21      PT LONG TERM GOAL #4   Title Decrease pt R hip pain to 3/10 or less and with a decrease in frequency for improved function and QOL    Baseline 3-6/10    Status New    Target Date 09/13/21                   Plan - 08/07/21 0810     Clinical Impression Statement PT was provided for R hip/LE strengthening and for R hip distraction and education on how the pt can complete on her own. Pt's subjective report indicates improved pain. Pt notes she has been completing her HEP minimally, but managing her sitting positions consistently. Pt tolerated today's PT session without adverse effects    Personal Factors and Comorbidities Past/Current Experience    Examination-Activity Limitations Locomotion Level;Squat;Stand    Stability/Clinical Decision Making  Stable/Uncomplicated    Clinical Decision Making Low    Rehab Potential Good    PT Frequency 1x / week    PT Duration 6 weeks    PT Treatment/Interventions ADLs/Self Care Home Management;Cryotherapy;Electrical Stimulation;Iontophoresis 6m/ml Dexamethasone;Moist Heat;Ultrasound;Gait training;Stair training;Neuromuscular re-education;Therapeutic exercise;Therapeutic activities;Functional mobility training;Balance training;Patient/family education;Manual techniques;Dry needling;Taping;Joint Manipulations    PT Next Visit Plan Assess response to HEP, progress ther ex as indicated, assess 6 min walking test and set goal    PT Home Exercise Plan [id number]   Consulted and Agree with Plan of Care Patient             Patient will benefit from skilled therapeutic intervention in order to improve the following deficits and impairments:  Difficulty walking, Decreased activity tolerance, Pain, Decreased strength  Visit Diagnosis: Muscle weakness (generalized)  Tear of right acetabular labrum, subsequent encounter  Difficulty in walking, not elsewhere classified     Problem List Patient Active Problem List   Diagnosis Date Noted   Labral tear of right hip joint 05/25/2017   Right hip pain 04/22/2017    08/07/21    PHYSICAL THERAPY DISCHARGE SUMMARY  Visits from Start of Care: 2  Current functional level related to goals / functional outcomes: See above. Pt self Dced with report of felling better   Remaining deficits: See above. Pt self Dced with report of felling better   Education / Equipment: HEP   Patient agrees to discharge. Patient goals were partially met. Patient is being discharged due to being pleased with the current functional level.   AGar PontoMS, PT 09/03/21 8:39 AM  AGar PontoMS, PT 08/07/21 9:49 PM   [facility] [address]Phone: [phone]  Fax:   [phone] Name: Tammy BoehmMRN: [id number] of Birth: [birth year]

## 2021-08-14 ENCOUNTER — Ambulatory Visit: Payer: 59

## 2021-08-21 ENCOUNTER — Ambulatory Visit: Payer: 59

## 2021-08-28 ENCOUNTER — Ambulatory Visit: Payer: 59

## 2021-09-04 ENCOUNTER — Other Ambulatory Visit: Payer: Self-pay

## 2021-09-04 ENCOUNTER — Ambulatory Visit: Payer: 59

## 2021-09-04 ENCOUNTER — Telehealth: Payer: Self-pay

## 2021-09-04 MED ORDER — MELOXICAM 15 MG PO TABS: 15.0000 mg | ORAL_TABLET | Freq: Every day | ORAL | 0 refills | Status: DC

## 2021-09-04 MED ORDER — TIZANIDINE HCL 2 MG PO TABS: 2.0000 mg | ORAL_TABLET | Freq: Every day | ORAL | 0 refills | Status: DC

## 2021-09-04 NOTE — Telephone Encounter (Signed)
[facility] pharmacy called about getting refills sent in for Patient the Tizanidine and Meloxicam.

## 2021-09-04 NOTE — Telephone Encounter (Signed)
Rx refilled.

## 2021-09-09 DIAGNOSIS — M9902 Segmental and somatic dysfunction of thoracic region: Secondary | ICD-10-CM | POA: Diagnosis not present

## 2021-09-09 DIAGNOSIS — M9903 Segmental and somatic dysfunction of lumbar region: Secondary | ICD-10-CM | POA: Diagnosis not present

## 2021-09-09 DIAGNOSIS — M9901 Segmental and somatic dysfunction of cervical region: Secondary | ICD-10-CM | POA: Diagnosis not present

## 2021-09-09 DIAGNOSIS — M9905 Segmental and somatic dysfunction of pelvic region: Secondary | ICD-10-CM | POA: Diagnosis not present

## 2021-09-16 DIAGNOSIS — M25551 Pain in right hip: Secondary | ICD-10-CM | POA: Diagnosis not present

## 2021-09-18 DIAGNOSIS — M9903 Segmental and somatic dysfunction of lumbar region: Secondary | ICD-10-CM | POA: Diagnosis not present

## 2021-09-18 DIAGNOSIS — M9901 Segmental and somatic dysfunction of cervical region: Secondary | ICD-10-CM | POA: Diagnosis not present

## 2021-09-18 DIAGNOSIS — M9902 Segmental and somatic dysfunction of thoracic region: Secondary | ICD-10-CM | POA: Diagnosis not present

## 2021-09-18 DIAGNOSIS — M9905 Segmental and somatic dysfunction of pelvic region: Secondary | ICD-10-CM | POA: Diagnosis not present

## 2021-09-30 NOTE — Progress Notes (Signed)
Tammy Cunningham [address] [place] Phone: [phone] Subjective:   Tammy Cunningham, am serving as a scribe for Dr. Hulan Saas. This visit occurred during the SARS-CoV-2 public health emergency.  Safety protocols were in place, including screening questions prior to the visit, additional usage of staff PPE, and extensive cleaning of exam room while observing appropriate contact time as indicated for disinfecting solutions.   I'm seeing this patient by the request  of:  Tammy Laurel K, NP  CC: right hip pain   [id number]  06/11/2021 Patient is having recurrent right hip pain.  4 years ago patient did have a labral tear.  Patient feels like it is very consistent with this again.  Patient does have significant positive impingement noted with a positive grind test noted.  On ultrasound does have inflammation.  Antalgic gait noted.  Affecting daily activities and waking up at night.  We will get x-rays at the moment but secondary to the feeling of instability I do feel that advanced imaging is warranted.  This is potentially affecting even her employment.  Follow-up with me again after imaging to discuss further.  Updated 10/01/2021 Tammy Cunningham is a 36 y.o. female coming in with complaint of hip pain. Would like injection. Recommendation for GP in [facility]. No other complaints.  Xray IMPRESSION: Trace retrolisthesis of L5 on S1. Otherwise negative radiographs of the lumbar spine.  Right hip (-)  MRI IMPRESSION: 1. New right anterior labral tear.     No past medical history on file. No past surgical history on file. Social History   Socioeconomic History   Marital status: Single    Spouse name: Not on file   Number of children: Not on file   Years of education: Not on file   Highest education level: Not on file  Occupational History   Not on file  Tobacco Use   Smoking status: Never   Smokeless tobacco: Never  Substance and Sexual  Activity   Alcohol use: Not on file   Drug use: Not on file   Sexual activity: Not on file  Other Topics Concern   Not on file  Social History Narrative   Not on file   Social Determinants of Health   Financial Resource Strain: Not on file  Food Insecurity: Not on file  Transportation Needs: Not on file  Physical Activity: Not on file  Stress: Not on file  Social Connections: Not on file   Allergies  Allergen Reactions   Peanut Oil Anaphylaxis   Amoxicillin Hives   Depakote Er  [Divalproex Sodium Er] Other (See Comments)    Liver failure   Lac Bovis Other (See Comments)    Breathing problems   Onion Other (See Comments)    breathing   Paroxetine Other (See Comments)    nausea   Penicillins Hives   Eggs Or Egg-Derived Products Other (See Comments)   No family history on file.  Current Outpatient Medications (Endocrine & Metabolic):    etonogestrel (NEXPLANON) 68 MG IMPL implant, Inject into the skin.   predniSONE (DELTASONE) 50 MG tablet, Take 1 tablet (50 mg total) by mouth daily.    Current Outpatient Medications (Analgesics):    ibuprofen (ADVIL,MOTRIN) 200 MG tablet, Take by mouth.   meloxicam (MOBIC) 15 MG tablet, Take 1 tablet (15 mg total) by mouth daily.   rizatriptan (MAXALT) 10 MG tablet, Take 10 mg by mouth as needed for migraine. May repeat in 2 hours  if needed   Current Outpatient Medications (Other):    ALPRAZolam (XANAX) 0.5 MG tablet, take 1 tablet by mouth once daily if needed for EXTREME ANXIETY   lamoTRIgine (LAMICTAL) 100 MG tablet, take 1 tablet by mouth twice a day   tiZANidine (ZANAFLEX) 2 MG tablet, Take 1 tablet (2 mg total) by mouth at bedtime.   Vitamin D, Ergocalciferol, (DRISDOL) 50000 units CAPS capsule, Take 1 capsule (50,000 Units total) by mouth every 7 (seven) days.   Reviewed prior external information including notes and imaging from  primary care provider As well as notes that were available from care everywhere and other  healthcare systems.  Past medical history, social, surgical and family history all reviewed in electronic medical record.  No pertanent information unless stated regarding to the chief complaint.   Review of Systems:  No headache, visual changes, nausea, vomiting, diarrhea, constipation, dizziness, abdominal pain, skin rash, fevers, chills, night sweats, weight loss, swollen lymph nodes, body aches, joint swelling, chest pain, shortness of breath, mood changes. POSITIVE muscle aches  Objective  Blood pressure 110/70, pulse 99, height 5\' 7"  (1.702 m), weight 146 lb (66.2 kg), SpO2 99 %.   General: No apparent distress alert and oriented x3 mood and affect normal, dressed appropriately.  HEENT: Pupils equal, extraocular movements intact  Respiratory: Patient's speak in full sentences and does not appear short of breath  Cardiovascular: No lower extremity edema, non tender, no erythema  MSK: When ambulating does favor the right leg.  Worsening pain with internal rotation.  Procedure: Real-time Ultrasound Guided Injection of right hip Device: GE Logiq Q7  Ultrasound guided injection is preferred based studies that show increased duration, increased effect, greater accuracy, decreased procedural pain, increased response rate with ultrasound guided versus blind injection.  Verbal informed consent obtained.  Time-out conducted.  Noted no overlying erythema, induration, or other signs of local infection.  Skin prepped in a sterile fashion.  Local anesthesia: Topical Ethyl chloride.  With sterile technique and under real time ultrasound guidance:  Anterior capsule visualized, needle visualized going to the head neck junction at the anterior capsule. Pictures taken. Patient did have injection of  2 cc of 0.5% Marcaine, and 1 cc of Kenalog 40 mg/dL. Completed without difficulty  Pain immediately resolved suggesting accurate placement of the medication.  Advised to call if fevers/chills, erythema,  induration, drainage, or persistent bleeding.  Impression: Technically successful ultrasound guided injection.    Impression and Recommendations:     The above documentation has been reviewed and is accurate and complete Tammy Pulley, DO

## 2021-10-01 ENCOUNTER — Ambulatory Visit: Payer: 59 | Admitting: Family Medicine

## 2021-10-01 ENCOUNTER — Encounter: Payer: Self-pay | Admitting: Family Medicine

## 2021-10-01 ENCOUNTER — Ambulatory Visit: Payer: Self-pay

## 2021-10-01 ENCOUNTER — Other Ambulatory Visit: Payer: Self-pay

## 2021-10-01 VITALS — BP 110/70 | HR 99 | Ht 67.0 in | Wt 146.0 lb

## 2021-10-01 DIAGNOSIS — M25551 Pain in right hip: Secondary | ICD-10-CM

## 2021-10-01 DIAGNOSIS — S73191D Other sprain of right hip, subsequent encounter: Secondary | ICD-10-CM | POA: Diagnosis not present

## 2021-10-01 MED ORDER — MELOXICAM 15 MG PO TABS: 15.0000 mg | ORAL_TABLET | Freq: Every day | ORAL | 0 refills | Status: DC

## 2021-10-01 NOTE — Patient Instructions (Addendum)
Injection today You know the drill Dr. Okey Dupre upstairs See you again when you need me

## 2021-10-01 NOTE — Assessment & Plan Note (Signed)
Patient given an injection.  Does have the acute labral tear.  Patient knows that there is a possibility for surgical intervention that will be needed.  Hopefully though this does get some improvement and we will see how patient does.  We discussed icing regimen and home exercises.  Have even discussed the possibility of PRP and other treatment options but this would be more experimental.  Refilled meloxicam for this chronic problem with exacerbation.  Follow-up with me again in 6 to 8 weeks

## 2021-10-02 DIAGNOSIS — M9902 Segmental and somatic dysfunction of thoracic region: Secondary | ICD-10-CM | POA: Diagnosis not present

## 2021-10-02 DIAGNOSIS — M9903 Segmental and somatic dysfunction of lumbar region: Secondary | ICD-10-CM | POA: Diagnosis not present

## 2021-10-02 DIAGNOSIS — M9905 Segmental and somatic dysfunction of pelvic region: Secondary | ICD-10-CM | POA: Diagnosis not present

## 2021-10-02 DIAGNOSIS — M9901 Segmental and somatic dysfunction of cervical region: Secondary | ICD-10-CM | POA: Diagnosis not present

## 2021-10-03 DIAGNOSIS — M25551 Pain in right hip: Secondary | ICD-10-CM | POA: Diagnosis not present

## 2021-10-03 DIAGNOSIS — R29898 Other symptoms and signs involving the musculoskeletal system: Secondary | ICD-10-CM | POA: Diagnosis not present

## 2021-10-04 ENCOUNTER — Encounter: Payer: Self-pay | Admitting: Family Medicine

## 2021-10-09 DIAGNOSIS — M9903 Segmental and somatic dysfunction of lumbar region: Secondary | ICD-10-CM | POA: Diagnosis not present

## 2021-10-09 DIAGNOSIS — M9905 Segmental and somatic dysfunction of pelvic region: Secondary | ICD-10-CM | POA: Diagnosis not present

## 2021-10-09 DIAGNOSIS — R29898 Other symptoms and signs involving the musculoskeletal system: Secondary | ICD-10-CM | POA: Diagnosis not present

## 2021-10-09 DIAGNOSIS — M9902 Segmental and somatic dysfunction of thoracic region: Secondary | ICD-10-CM | POA: Diagnosis not present

## 2021-10-09 DIAGNOSIS — M25551 Pain in right hip: Secondary | ICD-10-CM | POA: Diagnosis not present

## 2021-10-09 DIAGNOSIS — M9901 Segmental and somatic dysfunction of cervical region: Secondary | ICD-10-CM | POA: Diagnosis not present

## 2021-10-14 DIAGNOSIS — M25551 Pain in right hip: Secondary | ICD-10-CM | POA: Diagnosis not present

## 2021-10-14 DIAGNOSIS — R29898 Other symptoms and signs involving the musculoskeletal system: Secondary | ICD-10-CM | POA: Diagnosis not present

## 2021-10-16 ENCOUNTER — Other Ambulatory Visit: Payer: Self-pay

## 2021-10-16 DIAGNOSIS — S73191D Other sprain of right hip, subsequent encounter: Secondary | ICD-10-CM

## 2021-10-21 ENCOUNTER — Other Ambulatory Visit: Payer: Self-pay

## 2021-10-21 DIAGNOSIS — M25551 Pain in right hip: Secondary | ICD-10-CM | POA: Diagnosis not present

## 2021-10-21 DIAGNOSIS — M25552 Pain in left hip: Secondary | ICD-10-CM

## 2021-10-21 DIAGNOSIS — R29898 Other symptoms and signs involving the musculoskeletal system: Secondary | ICD-10-CM | POA: Diagnosis not present

## 2021-10-31 DIAGNOSIS — R29898 Other symptoms and signs involving the musculoskeletal system: Secondary | ICD-10-CM | POA: Diagnosis not present

## 2021-10-31 DIAGNOSIS — Z7409 Other reduced mobility: Secondary | ICD-10-CM | POA: Diagnosis not present

## 2021-10-31 DIAGNOSIS — M25551 Pain in right hip: Secondary | ICD-10-CM | POA: Diagnosis not present

## 2021-10-31 DIAGNOSIS — Z789 Other specified health status: Secondary | ICD-10-CM | POA: Diagnosis not present

## 2021-11-04 ENCOUNTER — Other Ambulatory Visit: Payer: 59

## 2021-11-04 ENCOUNTER — Other Ambulatory Visit: Payer: Self-pay

## 2021-11-04 ENCOUNTER — Ambulatory Visit
Admission: RE | Admit: 2021-11-04 | Discharge: 2021-11-04 | Disposition: A | Discharge status: Home / Self Care | Payer: 59 | Source: Ambulatory Visit | Attending: Family Medicine | Admitting: Family Medicine

## 2021-11-04 DIAGNOSIS — M25551 Pain in right hip: Secondary | ICD-10-CM | POA: Diagnosis not present

## 2021-11-04 DIAGNOSIS — M25552 Pain in left hip: Secondary | ICD-10-CM

## 2021-11-04 DIAGNOSIS — Z789 Other specified health status: Secondary | ICD-10-CM | POA: Diagnosis not present

## 2021-11-04 DIAGNOSIS — Z7409 Other reduced mobility: Secondary | ICD-10-CM | POA: Diagnosis not present

## 2021-11-04 DIAGNOSIS — S73192A Other sprain of left hip, initial encounter: Secondary | ICD-10-CM | POA: Diagnosis not present

## 2021-11-04 DIAGNOSIS — R29898 Other symptoms and signs involving the musculoskeletal system: Secondary | ICD-10-CM | POA: Diagnosis not present

## 2021-11-04 MED ORDER — IOPAMIDOL (ISOVUE-M 200) INJECTION 41%
13.0000 mL | Freq: Once | INTRAMUSCULAR | Status: AC
  Administered 2021-11-04: 13 mL via INTRA_ARTICULAR

## 2021-11-05 ENCOUNTER — Encounter: Payer: Self-pay | Admitting: Family Medicine

## 2021-11-11 DIAGNOSIS — Z7409 Other reduced mobility: Secondary | ICD-10-CM | POA: Diagnosis not present

## 2021-11-11 DIAGNOSIS — M25551 Pain in right hip: Secondary | ICD-10-CM | POA: Diagnosis not present

## 2021-11-11 DIAGNOSIS — R29898 Other symptoms and signs involving the musculoskeletal system: Secondary | ICD-10-CM | POA: Diagnosis not present

## 2021-11-11 DIAGNOSIS — Z789 Other specified health status: Secondary | ICD-10-CM | POA: Diagnosis not present

## 2021-11-17 DIAGNOSIS — M9903 Segmental and somatic dysfunction of lumbar region: Secondary | ICD-10-CM | POA: Diagnosis not present

## 2021-11-17 DIAGNOSIS — M9902 Segmental and somatic dysfunction of thoracic region: Secondary | ICD-10-CM | POA: Diagnosis not present

## 2021-11-17 DIAGNOSIS — M9905 Segmental and somatic dysfunction of pelvic region: Secondary | ICD-10-CM | POA: Diagnosis not present

## 2021-11-17 DIAGNOSIS — M9901 Segmental and somatic dysfunction of cervical region: Secondary | ICD-10-CM | POA: Diagnosis not present

## 2021-11-20 DIAGNOSIS — M9903 Segmental and somatic dysfunction of lumbar region: Secondary | ICD-10-CM | POA: Diagnosis not present

## 2021-11-20 DIAGNOSIS — M9905 Segmental and somatic dysfunction of pelvic region: Secondary | ICD-10-CM | POA: Diagnosis not present

## 2021-11-20 DIAGNOSIS — M9901 Segmental and somatic dysfunction of cervical region: Secondary | ICD-10-CM | POA: Diagnosis not present

## 2021-11-20 DIAGNOSIS — M9902 Segmental and somatic dysfunction of thoracic region: Secondary | ICD-10-CM | POA: Diagnosis not present

## 2021-11-22 ENCOUNTER — Other Ambulatory Visit: Payer: Self-pay | Admitting: Family Medicine

## 2021-11-24 DIAGNOSIS — M9903 Segmental and somatic dysfunction of lumbar region: Secondary | ICD-10-CM | POA: Diagnosis not present

## 2021-11-24 DIAGNOSIS — M9905 Segmental and somatic dysfunction of pelvic region: Secondary | ICD-10-CM | POA: Diagnosis not present

## 2021-11-24 DIAGNOSIS — M9902 Segmental and somatic dysfunction of thoracic region: Secondary | ICD-10-CM | POA: Diagnosis not present

## 2021-11-24 DIAGNOSIS — M9901 Segmental and somatic dysfunction of cervical region: Secondary | ICD-10-CM | POA: Diagnosis not present

## 2021-11-25 DIAGNOSIS — M25551 Pain in right hip: Secondary | ICD-10-CM | POA: Diagnosis not present

## 2021-11-28 DIAGNOSIS — M25551 Pain in right hip: Secondary | ICD-10-CM | POA: Diagnosis not present

## 2021-12-01 ENCOUNTER — Encounter: Payer: Self-pay | Admitting: Family Medicine

## 2021-12-02 ENCOUNTER — Other Ambulatory Visit: Payer: Self-pay

## 2021-12-02 DIAGNOSIS — S73191D Other sprain of right hip, subsequent encounter: Secondary | ICD-10-CM

## 2021-12-03 ENCOUNTER — Ambulatory Visit (HOSPITAL_BASED_OUTPATIENT_CLINIC_OR_DEPARTMENT_OTHER): Payer: 59 | Admitting: Orthopaedic Surgery

## 2021-12-03 ENCOUNTER — Ambulatory Visit (HOSPITAL_BASED_OUTPATIENT_CLINIC_OR_DEPARTMENT_OTHER)
Admission: RE | Admit: 2021-12-03 | Discharge: 2021-12-03 | Disposition: A | Discharge status: Home / Self Care | Payer: 59 | Source: Ambulatory Visit | Attending: Orthopaedic Surgery | Admitting: Orthopaedic Surgery

## 2021-12-03 ENCOUNTER — Other Ambulatory Visit (HOSPITAL_BASED_OUTPATIENT_CLINIC_OR_DEPARTMENT_OTHER): Payer: Self-pay | Admitting: Orthopaedic Surgery

## 2021-12-03 DIAGNOSIS — M25551 Pain in right hip: Secondary | ICD-10-CM | POA: Insufficient documentation

## 2021-12-03 DIAGNOSIS — S73191A Other sprain of right hip, initial encounter: Secondary | ICD-10-CM

## 2021-12-03 NOTE — Progress Notes (Signed)
? ?                            ? ? ?Chief Complaint: Right hip pain ?  ? ? ?History of Present Illness:  ? ? ?Tammy Cunningham is a 36 y.o. female presents today with ongoing right hip pain and instability.  Of note she did have surgery with Dr. Aretha Parrot in July 2018 in which she repaired a torn labrum in her right hip.  She subsequently had done very well.  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  She feels like the hip is giving out and is not stable.  She has previously been sent for physical therapy for 6 weeks and sprain which has seemed somewhat aggravate the hip.  She has been taking Mobic and Robaxin which somewhat helped but overall only to a slight degree.  She works Diplomatic Services operational officer.  She does have to go home really multiple days as the hip is sore and does feel like it is giving out. ? ? ? ?Surgical History:   ?None ? ?PMH/PSH/Family History/Social History/Meds/Allergies:   ?No past medical history on file. ?No past surgical history on file. ?Social History  ? ?Socioeconomic History  ? Marital status: Single  ?  Spouse name: Not on file  ? Number of children: Not on file  ? Years of education: Not on file  ? Highest education level: Not on file  ?Occupational History  ? Not on file  ?Tobacco Use  ? Smoking status: Never  ? Smokeless tobacco: Never  ?Substance and Sexual Activity  ? Alcohol use: Not on file  ? Drug use: Not on file  ? Sexual activity: Not on file  ?Other Topics Concern  ? Not on file  ?Social History Narrative  ? Not on file  ? ?Social Determinants of Health  ? ?Financial Resource Strain: Not on file  ?Food Insecurity: Not on file  ?Transportation Needs: Not on file  ?Physical Activity: Not on file  ?Stress: Not on file  ?Social Connections: Not on file  ? ?No family history on file. ?Allergies  ?Allergen Reactions  ? Peanut Oil Anaphylaxis  ? Amoxicillin Hives  ? Depakote Er  [Divalproex Sodium Er]  Other (See Comments)  ?  Liver failure  ? Lac Bovis Other (See Comments)  ?  Breathing problems  ? Onion Other (See Comments)  ?  breathing  ? Paroxetine Other (See Comments)  ?  nausea  ? Penicillins Hives  ? Eggs Or Egg-Derived Products Other (See Comments)  ? ?Current Outpatient Medications  ?Medication Sig Dispense Refill  ? ALPRAZolam (XANAX) 0.5 MG tablet take 1 tablet by mouth once daily if needed for EXTREME ANXIETY    ? etonogestrel (NEXPLANON) 68 MG IMPL implant Inject into the skin.    ? ibuprofen (ADVIL,MOTRIN) 200 MG tablet Take by mouth.    ? lamoTRIgine (LAMICTAL) 100 MG tablet take 1 tablet by mouth twice a day    ? meloxicam (MOBIC) 15 MG tablet Take 1 tablet (15 mg total) by mouth daily. 30 tablet 0  ? predniSONE (DELTASONE) 50 MG tablet Take 1 tablet (50 mg total) by mouth daily. 5 tablet 0  ? rizatriptan (MAXALT) 10 MG tablet Take 10 mg by mouth as needed for migraine. May repeat in 2 hours if needed    ? tiZANidine (ZANAFLEX)  2 MG tablet TAKE 1 TABLET BY MOUTH AT BEDTIME 30 tablet 0  ? Vitamin D, Ergocalciferol, (DRISDOL) 50000 units CAPS capsule Take 1 capsule (50,000 Units total) by mouth every 7 (seven) days. 12 capsule 0  ? ?No current facility-administered medications for this visit.  ? ?No results found. ? ?Review of Systems:   ?A ROS was performed including pertinent positives and negatives as documented in the HPI. ? ?Physical Exam :   ?Constitutional: NAD and appears stated age ?Neurological: Alert and oriented ?Psych: Appropriate affect and cooperative ?There were no vitals taken for this visit.  ? ?Comprehensive Musculoskeletal Exam:   ? ?Inspection Right Left  ?Skin No atrophy or gross abnormalities appreciated No atrophy or gross abnormalities appreciated  ?Palpation    ?Tenderness None None  ?Crepitus None None  ?Range of Motion    ?Flexion (passive) 120 120  ?Extension 30 30  ?IR 30 degrees with pain 30  ?ER 45 45  ?Strength    ?Flexion  5/5 5/5  ?Extension 5/5 5/5  ?Special Tests     ?FABER Negative Negative  ?FADIR Positive Negative  ?ER Lag/Capsular Insufficiency Positive Negative  ?Instability Positive Negative  ?Sacroiliac pain Negative  Negative   ?Instability    ?Generalized Laxity No No  ?Neurologic    ?sciatic, femoral, obturator nerves intact to light sensation  ?Vascular/Lymphatic    ?DP pulse 2+ 2+  ?Lumbar Exam    ?Patient has symmetric lumbar range of motion with negative pain referral to hip  ? ? ? ?Imaging:   ?Xray (4 views right hip including office profile): ?She has a normal lateral center edge angle without evidence of any type of femoral acetabular narrowing ? ?MRI right hip; ?She has a retorn anterior superior labrum which is diminutive in nature ? ?I personally reviewed and interpreted the radiographs. ? ? ?Assessment:   ?36 y.o. 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  She has been getting treatment with Dr. Aretha Parrot.  At this point he is recommending an additional labral repair versus hip arthroplasty.  I did have a long discussion with her today regarding this.  We did discuss that she is overall very young and as a result, I do believe that revision surgery for the hip would be an ideal situation in order to improve her outcome.  We did discuss that revision surgery does often lead to a less predictable result compared to primary surgery.  That being said she does have evidence of a diminutive and insufficient labrum on her MRI.  We did discuss that she may also benefit from a labral reconstruction which she understands.  She will ask Dr. Aretha Parrot about this.  I will see her back on an as-needed basis. ? ?Plan :   ? ?-Return to clinic as needed ? ? ? ? ?I personally saw and evaluated the patient, and participated in the management and treatment plan. ? ?Vanetta Mulders, MD ?Attending Physician, Orthopedic Surgery ? ?This document was dictated using Systems analyst. A reasonable attempt  at proof reading has been made to minimize errors. ?

## 2021-12-19 ENCOUNTER — Other Ambulatory Visit: Payer: Self-pay | Admitting: Family Medicine

## 2022-01-02 ENCOUNTER — Encounter: Payer: Self-pay | Admitting: Family

## 2022-01-02 ENCOUNTER — Ambulatory Visit (INDEPENDENT_AMBULATORY_CARE_PROVIDER_SITE_OTHER): Payer: 59 | Admitting: Family

## 2022-01-02 VITALS — BP 116/77 | HR 111 | Temp 97.7°F | Ht 67.0 in | Wt 143.4 lb

## 2022-01-02 DIAGNOSIS — R69 Illness, unspecified: Secondary | ICD-10-CM | POA: Diagnosis not present

## 2022-01-02 DIAGNOSIS — F988 Other specified behavioral and emotional disorders with onset usually occurring in childhood and adolescence: Secondary | ICD-10-CM

## 2022-01-02 DIAGNOSIS — G43701 Chronic migraine without aura, not intractable, with status migrainosus: Secondary | ICD-10-CM | POA: Diagnosis not present

## 2022-01-02 DIAGNOSIS — Z01818 Encounter for other preprocedural examination: Secondary | ICD-10-CM

## 2022-01-02 MED ORDER — AMPHETAMINE-DEXTROAMPHET ER 15 MG PO CP24: 15.0000 mg | ORAL_CAPSULE | ORAL | 0 refills | Status: DC

## 2022-01-02 MED ORDER — AMPHETAMINE-DEXTROAMPHETAMINE 15 MG PO TABS: 15.0000 mg | ORAL_TABLET | Freq: Every day | ORAL | 0 refills | Status: DC

## 2022-01-02 MED ORDER — TOPIRAMATE 50 MG PO TABS: 50.0000 mg | ORAL_TABLET | Freq: Two times a day (BID) | ORAL | 1 refills | Status: DC

## 2022-01-02 NOTE — Progress Notes (Addendum)
? ?New Patient Office Visit ? ?Subjective:  ?Patient ID: Tammy Cunningham, female    DOB: Jan 09, 1986  Age: 36 y.o. MRN: [id number] ? ?CC:  ?Chief Complaint  ?Patient presents with  ? Establish Care  ? Procedure  ?  Surgery clearance, torn cartilage in both hips but operation is on right hip. 5/17  ? ? ?HPI ?Tammy Cunningham presents for establishing care today. ?Right hip surgery:  has had surgery on same hip about 4 years ago for torn cartilage, and having same procedure again. Reports instability and continuous pain in hip. pt coaches and participates in roller derby. ?ADHD f/u: ?Medications helping target goals: Adderall ER & IR ?Regimen: daily ?Medication side effects/concerns: denies ?Weight: no change ?Sleep: denies issue ?Mood changes: denies ?Tics: denies ?Blood pressure, Weight, Pulse, Behavior reviewed: wnl.  ?Migraines:  reports having about 2 times per month and last a few days, unless taking Nurtec, then it lasts a 1 day. Uses Topamax preventatively, Maxalt for acute, prefers Nurtec but not covered with current insurance. Requesting refills today.  ? ? ?Assessment & Plan:  ? ?Problem List Items Addressed This Visit   ? ?  ? Cardiovascular and Mediastinum  ? Migraine with status migrainosus, not intractable  ?  Chronic - managed with current acute & preventative regimen. Sending refills today. ? ?  ?  ? Relevant Medications  ? EPINEPHrine 0.3 mg/0.3 mL IJ SOAJ injection  ? escitalopram (LEXAPRO) 20 MG tablet  ? topiramate (TOPAMAX) 50 MG tablet  ?  ? Other  ? Attention deficit disorder (ADD) in adult  ?  Chronic - stable with current med regimen. pt given paper RX for refills today due to current adhd med shortage. pt aware these can't be replaced if lost or stolen. PDMP checked & verified. f/u in 3 mos. ? ?  ?  ? Relevant Medications  ? amphetamine-dextroamphetamine (ADDERALL XR) 15 MG 24 hr capsule  ?amphetamine-dextroamphetamine (ADDERALL XR) 15 MG 24 hr capsule (Start on 03/03/2022)  ?amphetamine-dextroamphetamine  (ADDERALL XR) 15 MG 24 hr capsule (Start on 02/01/2022)  ?amphetamine-dextroamphetamine (ADDERALL) 15 MG tablet  ?amphetamine-dextroamphetamine (ADDERALL) 15 MG tablet (Start on 02/01/2022)  ?amphetamine-dextroamphetamine (ADDERALL) 15 MG tablet (Start on 03/03/2022)  ?  ?   ?   ?   ?   ?   ?   ? ?Other Visit Diagnoses   ? ? Preop examination    -  Primary ?pt is scheduled thru [facility] for right hip repair on 5/17 in [place]. Pt has had done previously in past. No medical concerns today, denies any previous problems with anesthesia, no hx of clotting, no cardiac hx. pre-op labs completed by surgeon. Pt is cleared for surgery from an internal medicine standpoint.  ? ?  ? ? ?Past Medical History:  ?Diagnosis Date  ? Allergy   ? Anxiety   ? Asthma   ? Depression   ? Seizures (HCC)   ? ? ?Past Surgical History:  ?Procedure Laterality Date  ? HIP SURGERY Right   ? 2019  ? OVARIAN CYST REMOVAL    ? 2005  ? ? ?Objective:  ? ?Today's Vitals: BP 116/77 (BP Location: Left Arm, Patient Position: Sitting, Cuff Size: Large)   Pulse (!) 111   Temp 97.7 ?F (36.5 ?C) (Temporal)   Ht 5\' 7"  (1.702 m)   Wt 143 lb 6 oz (65 kg)   LMP  (LMP Unknown) Comment: PILL  SpO2 100%   BMI 22.46 kg/m?  ? ?Physical Exam ?Vitals  and nursing note reviewed.  ?Constitutional:   ?   Appearance: Normal appearance.  ?Cardiovascular:  ?   Rate and Rhythm: Normal rate and regular rhythm.  ?Pulmonary:  ?   Effort: Pulmonary effort is normal.  ?   Breath sounds: Normal breath sounds.  ?Musculoskeletal:  ?   Right hip: Decreased range of motion.  ?Skin: ?   General: Skin is warm and dry.  ?Neurological:  ?   Mental Status: She is alert.  ?Psychiatric:     ?   Mood and Affect: Mood normal.     ?   Behavior: Behavior normal.  ? ? ?Outpatient Encounter Medications as of 01/02/2022  ?Medication Sig  ? amphetamine-dextroamphetamine (ADDERALL XR) 15 MG 24 hr capsule Take 1 capsule by mouth every morning.  ? [START ON 03/03/2022] amphetamine-dextroamphetamine  (ADDERALL XR) 15 MG 24 hr capsule Take 1 capsule by mouth every morning.  ? [START ON 02/01/2022] amphetamine-dextroamphetamine (ADDERALL XR) 15 MG 24 hr capsule Take 1 capsule by mouth every morning.  ? amphetamine-dextroamphetamine (ADDERALL) 15 MG tablet Take 1 tablet by mouth daily.  ? amphetamine-dextroamphetamine (ADDERALL) 15 MG tablet Take 1 tablet by mouth daily before breakfast.  ? [START ON 02/01/2022] amphetamine-dextroamphetamine (ADDERALL) 15 MG tablet Take 1 tablet by mouth daily before breakfast.  ? [START ON 03/03/2022] amphetamine-dextroamphetamine (ADDERALL) 15 MG tablet Take 1 tablet by mouth daily before breakfast.  ? EPINEPHrine 0.3 mg/0.3 mL IJ SOAJ injection Inject into the muscle.  ? escitalopram (LEXAPRO) 20 MG tablet Take 20 mg by mouth daily.  ? fexofenadine (ALLEGRA) 180 MG tablet Take by mouth.  ? JUNEL 1/20 1-20 MG-MCG tablet Take by mouth.  ? meloxicam (MOBIC) 15 MG tablet Take 1 tablet (15 mg total) by mouth daily.  ? rizatriptan (MAXALT) 10 MG tablet Take 10 mg by mouth as needed for migraine. May repeat in 2 hours if needed  ? tiZANidine (ZANAFLEX) 2 MG tablet TAKE 1 TABLET BY MOUTH EVERYDAY AT BEDTIME  ? [DISCONTINUED] amphetamine-dextroamphetamine (ADDERALL XR) 15 MG 24 hr capsule Take by mouth every morning.  ? [DISCONTINUED] topiramate (TOPAMAX) 50 MG tablet Take by mouth.  ? topiramate (TOPAMAX) 50 MG tablet Take 1 tablet (50 mg total) by mouth 2 (two) times daily.  ? [DISCONTINUED] ALPRAZolam (XANAX) 0.5 MG tablet take 1 tablet by mouth once daily if needed for EXTREME ANXIETY  ? [DISCONTINUED] etonogestrel (NEXPLANON) 68 MG IMPL implant Inject into the skin.  ? [DISCONTINUED] ibuprofen (ADVIL,MOTRIN) 200 MG tablet Take by mouth.  ? [DISCONTINUED] lamoTRIgine (LAMICTAL) 100 MG tablet take 1 tablet by mouth twice a day  ? [DISCONTINUED] predniSONE (DELTASONE) 50 MG tablet Take 1 tablet (50 mg total) by mouth daily.  ? [DISCONTINUED] Vitamin D, Ergocalciferol, (DRISDOL) 50000 units  CAPS capsule Take 1 capsule (50,000 Units total) by mouth every 7 (seven) days.  ? ?No facility-administered encounter medications on file as of 01/02/2022.  ? ? ?Follow-up: Return in about 3 months (around 04/04/2022) for adderall refills- can be virtual or in office.  ? ?Dulce Sellar, NP ?

## 2022-01-02 NOTE — Patient Instructions (Addendum)
Welcome to [facility]! It was a pleasure meeting you today. ? ?As discussed, check with your insurance to see if Emgality or Ajovy injectables (and there is another that I can't remember name of) are covered for prevention of migraines.  ?I have sent a refill of your Topamax and Adderall IR (3 months to have on file) to your pharmacy. ?You are cleared for surgery from a medical standpoint.  ? ?Please schedule a 3 month follow up visit today for refills.  ? ? ? ? ?PLEASE NOTE: ? ?If you had any LAB tests please let us know if you have not heard back within a few days. You may see your results on MyChart before we have a chance to review them but we will give you a call once they are reviewed by [place]. If we ordered any REFERRALS today, please let us know if you have not heard from their office within the next week.  ?Let us know through MyChart if you are needing REFILLS, or have your pharmacy send [place] the request. You can also use MyChart to communicate with me or any office staff. ? ?Please try these tips to maintain a healthy lifestyle: ? ?Eat most of your calories during the day when you are active. Eliminate processed foods including packaged sweets (pies, cakes, cookies), reduce intake of potatoes, white bread, white pasta, and white rice. Look for whole grain options, oat flour or almond flour. ? ?Each meal should contain half fruits/vegetables, one quarter protein, and one quarter carbs (no bigger than a computer mouse). ? ?Cut down on sweet beverages. This includes juice, soda, and sweet tea. Also watch fruit intake, though this is a healthier sweet option, it still contains natural sugar! Limit to 3 servings daily. ? ?Drink at least 1 glass of water with each meal and aim for at least 8 glasses per day ? ?Exercise at least 150 minutes every week.  ? ?

## 2022-01-04 ENCOUNTER — Encounter: Payer: Self-pay | Admitting: Family

## 2022-01-04 NOTE — Assessment & Plan Note (Signed)
Chronic - managed with current acute & preventative regimen. Sending refills today. ?

## 2022-01-04 NOTE — Assessment & Plan Note (Signed)
Chronic - stable with current med regimen. pt given paper RX for refills today due to current adhd med shortage. pt aware these can't be replaced if lost or stolen. PDMP checked & verified. f/u in 3 mos. ?

## 2022-01-07 ENCOUNTER — Encounter: Payer: Self-pay | Admitting: Family

## 2022-01-08 NOTE — Telephone Encounter (Signed)
I am not writing a letter, my office notes should be sufficient. Please fax my visit notes to them. I specifically addressed clearance for surgery in my note. Thanks.

## 2022-01-13 DIAGNOSIS — M25551 Pain in right hip: Secondary | ICD-10-CM | POA: Diagnosis not present

## 2022-01-14 DIAGNOSIS — M94251 Chondromalacia, right hip: Secondary | ICD-10-CM | POA: Diagnosis not present

## 2022-01-14 DIAGNOSIS — X58XXXA Exposure to other specified factors, initial encounter: Secondary | ICD-10-CM | POA: Diagnosis not present

## 2022-01-14 DIAGNOSIS — M659 Synovitis and tenosynovitis, unspecified: Secondary | ICD-10-CM | POA: Diagnosis not present

## 2022-01-14 DIAGNOSIS — M65851 Other synovitis and tenosynovitis, right thigh: Secondary | ICD-10-CM | POA: Diagnosis not present

## 2022-01-14 DIAGNOSIS — S73191A Other sprain of right hip, initial encounter: Secondary | ICD-10-CM | POA: Diagnosis not present

## 2022-01-14 DIAGNOSIS — M25851 Other specified joint disorders, right hip: Secondary | ICD-10-CM | POA: Diagnosis not present

## 2022-01-14 DIAGNOSIS — M25551 Pain in right hip: Secondary | ICD-10-CM | POA: Diagnosis not present

## 2022-01-18 DIAGNOSIS — T801XXA Vascular complications following infusion, transfusion and therapeutic injection, initial encounter: Secondary | ICD-10-CM | POA: Diagnosis not present

## 2022-01-18 DIAGNOSIS — I809 Phlebitis and thrombophlebitis of unspecified site: Secondary | ICD-10-CM | POA: Diagnosis not present

## 2022-01-24 ENCOUNTER — Other Ambulatory Visit: Payer: Self-pay | Admitting: Family Medicine

## 2022-01-30 DIAGNOSIS — M25551 Pain in right hip: Secondary | ICD-10-CM | POA: Diagnosis not present

## 2022-01-30 DIAGNOSIS — R29898 Other symptoms and signs involving the musculoskeletal system: Secondary | ICD-10-CM | POA: Diagnosis not present

## 2022-01-30 DIAGNOSIS — Z7409 Other reduced mobility: Secondary | ICD-10-CM | POA: Diagnosis not present

## 2022-02-04 DIAGNOSIS — Z7409 Other reduced mobility: Secondary | ICD-10-CM | POA: Diagnosis not present

## 2022-02-04 DIAGNOSIS — R29898 Other symptoms and signs involving the musculoskeletal system: Secondary | ICD-10-CM | POA: Diagnosis not present

## 2022-02-04 DIAGNOSIS — M25551 Pain in right hip: Secondary | ICD-10-CM | POA: Diagnosis not present

## 2022-02-06 DIAGNOSIS — M25551 Pain in right hip: Secondary | ICD-10-CM | POA: Diagnosis not present

## 2022-02-06 DIAGNOSIS — R29898 Other symptoms and signs involving the musculoskeletal system: Secondary | ICD-10-CM | POA: Diagnosis not present

## 2022-02-06 DIAGNOSIS — Z7409 Other reduced mobility: Secondary | ICD-10-CM | POA: Diagnosis not present

## 2022-02-10 DIAGNOSIS — M25551 Pain in right hip: Secondary | ICD-10-CM | POA: Diagnosis not present

## 2022-02-11 DIAGNOSIS — Z7409 Other reduced mobility: Secondary | ICD-10-CM | POA: Diagnosis not present

## 2022-02-11 DIAGNOSIS — M25551 Pain in right hip: Secondary | ICD-10-CM | POA: Diagnosis not present

## 2022-02-11 DIAGNOSIS — R29898 Other symptoms and signs involving the musculoskeletal system: Secondary | ICD-10-CM | POA: Diagnosis not present

## 2022-02-13 DIAGNOSIS — R29898 Other symptoms and signs involving the musculoskeletal system: Secondary | ICD-10-CM | POA: Diagnosis not present

## 2022-02-13 DIAGNOSIS — Z7409 Other reduced mobility: Secondary | ICD-10-CM | POA: Diagnosis not present

## 2022-02-13 DIAGNOSIS — M25551 Pain in right hip: Secondary | ICD-10-CM | POA: Diagnosis not present

## 2022-02-20 DIAGNOSIS — M25551 Pain in right hip: Secondary | ICD-10-CM | POA: Diagnosis not present

## 2022-02-20 DIAGNOSIS — R29898 Other symptoms and signs involving the musculoskeletal system: Secondary | ICD-10-CM | POA: Diagnosis not present

## 2022-02-20 DIAGNOSIS — Z7409 Other reduced mobility: Secondary | ICD-10-CM | POA: Diagnosis not present

## 2022-02-22 ENCOUNTER — Other Ambulatory Visit: Payer: Self-pay | Admitting: Family Medicine

## 2022-02-24 DIAGNOSIS — R29898 Other symptoms and signs involving the musculoskeletal system: Secondary | ICD-10-CM | POA: Diagnosis not present

## 2022-02-24 DIAGNOSIS — Z7409 Other reduced mobility: Secondary | ICD-10-CM | POA: Diagnosis not present

## 2022-02-24 DIAGNOSIS — M25551 Pain in right hip: Secondary | ICD-10-CM | POA: Diagnosis not present

## 2022-02-25 ENCOUNTER — Ambulatory Visit: Payer: 59 | Admitting: Internal Medicine

## 2022-02-25 ENCOUNTER — Telehealth: Payer: Self-pay

## 2022-02-25 NOTE — Telephone Encounter (Signed)
Called pt. LDVM that her New pt appt has been cancelled due to her establishing care with another [facility] provider. Office number was provided

## 2022-02-27 DIAGNOSIS — R29898 Other symptoms and signs involving the musculoskeletal system: Secondary | ICD-10-CM | POA: Diagnosis not present

## 2022-02-27 DIAGNOSIS — M25551 Pain in right hip: Secondary | ICD-10-CM | POA: Diagnosis not present

## 2022-02-27 DIAGNOSIS — Z7409 Other reduced mobility: Secondary | ICD-10-CM | POA: Diagnosis not present

## 2022-03-06 DIAGNOSIS — G43909 Migraine, unspecified, not intractable, without status migrainosus: Secondary | ICD-10-CM | POA: Diagnosis not present

## 2022-03-28 ENCOUNTER — Other Ambulatory Visit: Payer: Self-pay | Admitting: Family Medicine

## 2022-04-03 DIAGNOSIS — F900 Attention-deficit hyperactivity disorder, predominantly inattentive type: Secondary | ICD-10-CM | POA: Diagnosis not present

## 2022-04-03 DIAGNOSIS — R69 Illness, unspecified: Secondary | ICD-10-CM | POA: Diagnosis not present

## 2022-04-06 ENCOUNTER — Ambulatory Visit (INDEPENDENT_AMBULATORY_CARE_PROVIDER_SITE_OTHER): Payer: 59 | Admitting: Family

## 2022-04-06 ENCOUNTER — Encounter: Payer: Self-pay | Admitting: Family

## 2022-04-06 VITALS — BP 110/78 | HR 75 | Temp 98.0°F | Ht 67.0 in | Wt 142.1 lb

## 2022-04-06 DIAGNOSIS — F419 Anxiety disorder, unspecified: Secondary | ICD-10-CM | POA: Diagnosis not present

## 2022-04-06 DIAGNOSIS — F32A Depression, unspecified: Secondary | ICD-10-CM | POA: Diagnosis not present

## 2022-04-06 DIAGNOSIS — G43701 Chronic migraine without aura, not intractable, with status migrainosus: Secondary | ICD-10-CM | POA: Diagnosis not present

## 2022-04-06 DIAGNOSIS — R69 Illness, unspecified: Secondary | ICD-10-CM | POA: Diagnosis not present

## 2022-04-06 DIAGNOSIS — F988 Other specified behavioral and emotional disorders with onset usually occurring in childhood and adolescence: Secondary | ICD-10-CM

## 2022-04-06 MED ORDER — ESCITALOPRAM OXALATE 20 MG PO TABS: 20.0000 mg | ORAL_TABLET | Freq: Every day | ORAL | 1 refills | Status: DC

## 2022-04-06 MED ORDER — AMPHETAMINE-DEXTROAMPHET ER 15 MG PO CP24
15.0000 mg | ORAL_CAPSULE | ORAL | 0 refills | Status: DC
Start: 2022-04-06 — End: 2022-07-20

## 2022-04-06 MED ORDER — AMPHETAMINE-DEXTROAMPHETAMINE 15 MG PO TABS
15.0000 mg | ORAL_TABLET | Freq: Every day | ORAL | 0 refills | Status: DC
Start: 2022-05-07 — End: 2022-07-20

## 2022-04-06 MED ORDER — AMPHETAMINE-DEXTROAMPHET ER 15 MG PO CP24: 15.0000 mg | ORAL_CAPSULE | ORAL | 0 refills | Status: DC

## 2022-04-06 MED ORDER — AMPHETAMINE-DEXTROAMPHET ER 15 MG PO CP24
15.0000 mg | ORAL_CAPSULE | ORAL | 0 refills | Status: DC
Start: 2022-05-07 — End: 2022-07-20

## 2022-04-06 MED ORDER — AMPHETAMINE-DEXTROAMPHETAMINE 15 MG PO TABS: 1.0000 | ORAL_TABLET | Freq: Every day | ORAL | 0 refills | Status: DC

## 2022-04-06 MED ORDER — AMPHETAMINE-DEXTROAMPHETAMINE 15 MG PO TABS
15.0000 mg | ORAL_TABLET | Freq: Every day | ORAL | 0 refills | Status: DC
Start: 2022-04-06 — End: 2022-07-20

## 2022-04-06 MED ORDER — NURTEC 75 MG PO TBDP: 1.0000 | ORAL_TABLET | Freq: Every day | ORAL | 1 refills | Status: DC

## 2022-04-06 NOTE — Assessment & Plan Note (Signed)
   Chronic   Topamax for preventative, pt prefers Nurtec for acute, works better & less SE than Imitrex & Topamax  sending refill for Nurtec, pt has a coupon she wants to try & use  f/u 6 mos or prn

## 2022-04-06 NOTE — Assessment & Plan Note (Signed)
chronic stable on Lexapro 20mg qd sending refill today f/u in 6 mos 

## 2022-04-06 NOTE — Progress Notes (Signed)
Patient ID: Tammy Cunningham, female    DOB: Jan 21, 1986, 36 y.o.   MRN: [id number]  Chief Complaint  Patient presents with   ADHD    Adderall Refill   Migraine    Pt would like nurtec, Which she used in the past. Insurance does not cover but she needs a refill.     HPI: ADHD f/u: Medications helping target goals: Adderall ER & IR Regimen: daily Medication side effects/concerns: denies Weight: no change Sleep: denies issue Mood changes: denies Tics: denies Blood pressure, Weight, Pulse, Behavior reviewed: wnl.  Migraines:  reports having about 2 times per month and last a few days, unless taking Nurtec, then it lasts a 1 day. Uses Topamax preventatively, Maxalt for acute, prefers Nurtec but not covered with current insurance. Reports she is going to try and use an online coupon for the Nurtec. Requesting refills today. Anxiety/Depression: Patient complains of anxiety disorder and depression .   She has the following symptoms: none.  Onset of symptoms was approximately  years ago, She denies current suicidal and homicidal ideation.  Possible organic causes contributing are: none.  Risk factors: previous episode of depression  Previous treatment includes Lexapro and individual therapy.  She complains of the following side effects from the treatment: none.  Assessment & Plan:   Problem List Items Addressed This Visit       Cardiovascular and Mediastinum   Migraine with status migrainosus, not intractable    Chronic  Topamax for preventative, pt prefers Nurtec for acute, works better & less SE than Imitrex & Topamax sending refill for Nurtec, pt has a coupon she wants to try & use f/u 6 mos or prn      Relevant Medications   Rimegepant Sulfate (NURTEC) 75 MG TBDP   escitalopram (LEXAPRO) 20 MG tablet     Other   Attention deficit disorder (ADD) in adult - Primary    chronic stable on 15mg  ER Adderall & 15mg  IR refilling today PDMP checked & verified f/u 3 mos       Relevant Medications   amphetamine-dextroamphetamine (ADDERALL XR) 15 MG 24 hr capsule (Start on 06/06/2022)   amphetamine-dextroamphetamine (ADDERALL XR) 15 MG 24 hr capsule (Start on 05/07/2022)   amphetamine-dextroamphetamine (ADDERALL XR) 15 MG 24 hr capsule   amphetamine-dextroamphetamine (ADDERALL) 15 MG tablet (Start on 06/06/2022)   amphetamine-dextroamphetamine (ADDERALL) 15 MG tablet (Start on 05/07/2022)   amphetamine-dextroamphetamine (ADDERALL) 15 MG tablet   Anxiety and depression    chronic stable on Lexapro 20mg  qd sending refill today f/u in 6 mos      Relevant Medications   escitalopram (LEXAPRO) 20 MG tablet    Subjective:    Outpatient Medications Prior to Visit  Medication Sig Dispense Refill   EPINEPHrine 0.3 mg/0.3 mL IJ SOAJ injection Inject into the muscle.     fexofenadine (ALLEGRA) 180 MG tablet Take by mouth.     JUNEL 1/20 1-20 MG-MCG tablet Take by mouth.     rizatriptan (MAXALT) 10 MG tablet Take 10 mg by mouth as needed for migraine. May repeat in 2 hours if needed     tiZANidine (ZANAFLEX) 2 MG tablet TAKE 1 TABLET BY MOUTH EVERYDAY AT BEDTIME (Patient taking differently: as needed.) 30 tablet 0   topiramate (TOPAMAX) 50 MG tablet Take 1 tablet (50 mg total) by mouth 2 (two) times daily. 180 tablet 1   amphetamine-dextroamphetamine (ADDERALL) 15 MG tablet Take 1 tablet by mouth daily.     escitalopram (LEXAPRO) 20 MG tablet  Take 20 mg by mouth daily.     meloxicam (MOBIC) 15 MG tablet Take 1 tablet (15 mg total) by mouth daily. 30 tablet 0   amphetamine-dextroamphetamine (ADDERALL XR) 15 MG 24 hr capsule Take 1 capsule by mouth every morning. 30 capsule 0   amphetamine-dextroamphetamine (ADDERALL XR) 15 MG 24 hr capsule Take 1 capsule by mouth every morning. 30 capsule 0   amphetamine-dextroamphetamine (ADDERALL XR) 15 MG 24 hr capsule Take 1 capsule by mouth every morning. 30 capsule 0   amphetamine-dextroamphetamine (ADDERALL) 15 MG tablet Take 1 tablet  by mouth daily before breakfast. 30 tablet 0   amphetamine-dextroamphetamine (ADDERALL) 15 MG tablet Take 1 tablet by mouth daily before breakfast. 30 tablet 0   amphetamine-dextroamphetamine (ADDERALL) 15 MG tablet Take 1 tablet by mouth daily before breakfast. 30 tablet 0   No facility-administered medications prior to visit.   Past Medical History:  Diagnosis Date   Allergy    Anxiety    Asthma    Depression    GAD (generalized anxiety disorder) 05/08/2015   High risk medication use 01/21/2015   Major depression in complete remission (HCC) 05/08/2015   Seizures (HCC)    Past Surgical History:  Procedure Laterality Date   HIP SURGERY Right    2019   OVARIAN CYST REMOVAL     2005   Allergies  Allergen Reactions   Peanut Oil Anaphylaxis   Amoxicillin Hives   Depakote Er  [Divalproex Sodium Er] Other (See Comments)    Liver failure   Milk (Cow) Other (See Comments)    Breathing problems   Onion Other (See Comments)    breathing   Paroxetine Other (See Comments)    nausea   Penicillins Hives   Aspartame Nausea And Vomiting   Lamotrigine     Other reaction(s): Loss of Consciousness Elevated LFTs   Sucralose     Other reaction(s): GI Upset (intolerance)   Tilactase     Other reaction(s): Respiratory Distress (ALLERGY/intolerance) wheezing   Wheat Bran     Other reaction(s): Respiratory Distress (ALLERGY/intolerance)   Eggs Or Egg-Derived Products Other (See Comments)      Objective:    Physical Exam Vitals and nursing note reviewed.  Constitutional:      Appearance: Normal appearance.  Cardiovascular:     Rate and Rhythm: Normal rate and regular rhythm.  Pulmonary:     Effort: Pulmonary effort is normal.     Breath sounds: Normal breath sounds.  Musculoskeletal:        General: Normal range of motion.  Skin:    General: Skin is warm and dry.  Neurological:     Mental Status: She is alert.  Psychiatric:        Mood and Affect: Mood normal.        Behavior:  Behavior normal.    BP 110/78 (BP Location: Left Arm, Patient Position: Sitting, Cuff Size: Large)   Pulse 75   Temp 98 F (36.7 C) (Temporal)   Ht 5\' 7"  (1.702 m)   Wt 142 lb 2 oz (64.5 kg)   LMP  (LMP Unknown)   SpO2 98%   BMI 22.26 kg/m  Wt Readings from Last 3 Encounters:  04/06/22 142 lb 2 oz (64.5 kg)  01/02/22 143 lb 6 oz (65 kg)  10/01/21 146 lb (66.2 kg)       11/29/21, NP

## 2022-04-06 NOTE — Assessment & Plan Note (Signed)
chronic stable on 15mg ER Adderall & 15mg IR refilling today PDMP checked & verified f/u 3 mos 

## 2022-04-23 ENCOUNTER — Ambulatory Visit: Payer: 59 | Admitting: Internal Medicine

## 2022-04-29 ENCOUNTER — Other Ambulatory Visit: Payer: Self-pay | Admitting: Family Medicine

## 2022-05-01 DIAGNOSIS — R69 Illness, unspecified: Secondary | ICD-10-CM | POA: Diagnosis not present

## 2022-05-01 DIAGNOSIS — F900 Attention-deficit hyperactivity disorder, predominantly inattentive type: Secondary | ICD-10-CM | POA: Diagnosis not present

## 2022-05-09 DIAGNOSIS — R519 Headache, unspecified: Secondary | ICD-10-CM | POA: Diagnosis not present

## 2022-05-09 DIAGNOSIS — G43909 Migraine, unspecified, not intractable, without status migrainosus: Secondary | ICD-10-CM | POA: Diagnosis not present

## 2022-05-19 DIAGNOSIS — M25552 Pain in left hip: Secondary | ICD-10-CM | POA: Diagnosis not present

## 2022-05-22 ENCOUNTER — Other Ambulatory Visit: Payer: Self-pay | Admitting: Family Medicine

## 2022-05-29 DIAGNOSIS — R69 Illness, unspecified: Secondary | ICD-10-CM | POA: Diagnosis not present

## 2022-05-29 DIAGNOSIS — F900 Attention-deficit hyperactivity disorder, predominantly inattentive type: Secondary | ICD-10-CM | POA: Diagnosis not present

## 2022-06-10 ENCOUNTER — Other Ambulatory Visit: Payer: Self-pay | Admitting: Family Medicine

## 2022-06-15 ENCOUNTER — Encounter: Payer: Self-pay | Admitting: Family

## 2022-06-15 ENCOUNTER — Ambulatory Visit (INDEPENDENT_AMBULATORY_CARE_PROVIDER_SITE_OTHER): Payer: 59 | Admitting: Family

## 2022-06-15 VITALS — BP 118/72 | HR 90 | Temp 98.0°F | Ht 67.0 in | Wt 138.2 lb

## 2022-06-15 DIAGNOSIS — R053 Chronic cough: Secondary | ICD-10-CM

## 2022-06-15 MED ORDER — HYDROCODONE BIT-HOMATROP MBR 5-1.5 MG/5ML PO SOLN: 5.0000 mL | Freq: Four times a day (QID) | ORAL | 0 refills | Status: DC | PRN

## 2022-06-15 MED ORDER — PREDNISONE 10 MG PO TABS: ORAL_TABLET | ORAL | 0 refills | Status: DC

## 2022-06-15 NOTE — Progress Notes (Signed)
Patient ID: Tammy Cunningham, female    DOB: 11-17-1985, 36 y.o.   MRN: [id number]  Chief Complaint  Patient presents with   Cough    Pt c/o dry cough for about 3 weeks. Has tried robitussin which does help it some. But pt is still not able to sleep.     HPI:      Chronic cough:  started as a cold/sinus a month ago, tested neg for covid, felt better for a week, but cough returned, with no other symptoms, other than some SOB and her Albuterol inhaler is helping some.  Has tried robitussin which does help it some. But pt is still not able to sleep.   Assessment & Plan:  1. Persistent cough for 3 weeks or longer - sending pred pack and cough syrup, advised on use & SE, continue Albuterol inhaler prn, drink lots of fluids, ok to take 600mg  Ibuprofen or 2 Naproxen for pleuritic chest pain.  - predniSONE (DELTASONE) 10 MG tablet; Take in the mornings:  6 tab day 1, 5 tab day 2, 4 tab day 3-4, 3 tab day 5, 2 tab day 6  Dispense: 26 tablet; Refill: 0 - HYDROcodone bit-homatropine (HYCODAN) 5-1.5 MG/5ML syrup; Take 5 mLs by mouth every 6 (six) hours as needed for cough.  Dispense: 120 mL; Refill: 0    Subjective:    Outpatient Medications Prior to Visit  Medication Sig Dispense Refill   albuterol (VENTOLIN HFA) 108 (90 Base) MCG/ACT inhaler Inhale 1-2 puffs into the lungs every 6 (six) hours as needed for wheezing or shortness of breath.     amphetamine-dextroamphetamine (ADDERALL XR) 15 MG 24 hr capsule Take 1 capsule by mouth every morning. 30 capsule 0   amphetamine-dextroamphetamine (ADDERALL) 15 MG tablet Take 1 tablet by mouth daily. 30 tablet 0   EPINEPHrine 0.3 mg/0.3 mL IJ SOAJ injection Inject into the muscle.     escitalopram (LEXAPRO) 20 MG tablet Take 1 tablet (20 mg total) by mouth daily. 90 tablet 1   fexofenadine (ALLEGRA) 180 MG tablet Take by mouth.     JUNEL 1/20 1-20 MG-MCG tablet Take by mouth.     Rimegepant Sulfate (NURTEC) 75 MG TBDP Take 1 tablet by mouth daily. 48 tablet 1    rizatriptan (MAXALT) 10 MG tablet Take 10 mg by mouth as needed for migraine. May repeat in 2 hours if needed     tiZANidine (ZANAFLEX) 2 MG tablet TAKE 1 TABLET BY MOUTH EVERYDAY AT BEDTIME 90 tablet 1   topiramate (TOPAMAX) 50 MG tablet Take 1 tablet (50 mg total) by mouth 2 (two) times daily. 180 tablet 1   amphetamine-dextroamphetamine (ADDERALL XR) 15 MG 24 hr capsule Take 1 capsule by mouth every morning. 30 capsule 0   amphetamine-dextroamphetamine (ADDERALL XR) 15 MG 24 hr capsule Take 1 capsule by mouth every morning. 30 capsule 0   amphetamine-dextroamphetamine (ADDERALL) 15 MG tablet Take 1 tablet by mouth daily before breakfast. 30 tablet 0   amphetamine-dextroamphetamine (ADDERALL) 15 MG tablet Take 1 tablet by mouth daily before breakfast. 30 tablet 0   No facility-administered medications prior to visit.   Past Medical History:  Diagnosis Date   Allergy    Anxiety    Asthma    Depression    GAD (generalized anxiety disorder) 05/08/2015   High risk medication use 01/21/2015   Lipid screening 09/05/2020   Major depression in complete remission ([place]) 05/08/2015   Neck pain 05/27/2021   Seizures ([place])  Unexplained weight loss 02/10/2013   Past Surgical History:  Procedure Laterality Date   HIP SURGERY Right    2019   OVARIAN CYST REMOVAL     2005   Allergies  Allergen Reactions   Peanut Oil Anaphylaxis   Amoxicillin Hives   Depakote Er  [Divalproex Sodium Er] Other (See Comments)    Liver failure   Milk (Cow) Other (See Comments)    Breathing problems   Onion Other (See Comments)    breathing   Paroxetine Other (See Comments)    nausea   Penicillins Hives   Aspartame Nausea And Vomiting   Lamotrigine     Other reaction(s): Loss of Consciousness Elevated LFTs   Sucralose     Other reaction(s): GI Upset (intolerance)   Tilactase     Other reaction(s): Respiratory Distress (ALLERGY/intolerance) wheezing   Wheat Bran     Other reaction(s): Respiratory Distress  (ALLERGY/intolerance)   Eggs Or Egg-Derived Products Other (See Comments)      Objective:    Physical Exam Vitals and nursing note reviewed.  Constitutional:      Appearance: Normal appearance. She is ill-appearing.     Interventions: Face mask in place.  HENT:     Right Ear: Tympanic membrane and ear canal normal.     Left Ear: Tympanic membrane and ear canal normal.     Nose:     Right Sinus: No frontal sinus tenderness.     Left Sinus: No frontal sinus tenderness.     Mouth/Throat:     Mouth: Mucous membranes are moist.     Pharynx: No pharyngeal swelling, oropharyngeal exudate, posterior oropharyngeal erythema or uvula swelling.     Tonsils: No tonsillar exudate or tonsillar abscesses.  Cardiovascular:     Rate and Rhythm: Normal rate and regular rhythm.  Pulmonary:     Effort: Pulmonary effort is normal.     Breath sounds: Normal breath sounds.  Musculoskeletal:        General: Normal range of motion.  Lymphadenopathy:     Head:     Right side of head: No preauricular or posterior auricular adenopathy.     Left side of head: No preauricular or posterior auricular adenopathy.     Cervical: No cervical adenopathy.  Skin:    General: Skin is warm and dry.  Neurological:     Mental Status: She is alert.  Psychiatric:        Mood and Affect: Mood normal.        Behavior: Behavior normal.    BP 118/72 (BP Location: Left Arm, Patient Position: Sitting, Cuff Size: Large)   Pulse 90   Temp 98 F (36.7 C) (Temporal)   Ht 5\' 7"  (1.702 m)   Wt 138 lb 4 oz (62.7 kg)   SpO2 97%   BMI 21.65 kg/m  Wt Readings from Last 3 Encounters:  06/15/22 138 lb 4 oz (62.7 kg)  04/06/22 142 lb 2 oz (64.5 kg)  01/02/22 143 lb 6 oz (65 kg)    03/04/22, NP

## 2022-06-19 DIAGNOSIS — F900 Attention-deficit hyperactivity disorder, predominantly inattentive type: Secondary | ICD-10-CM | POA: Diagnosis not present

## 2022-06-19 DIAGNOSIS — R69 Illness, unspecified: Secondary | ICD-10-CM | POA: Diagnosis not present

## 2022-06-26 ENCOUNTER — Encounter: Payer: Self-pay | Admitting: Family

## 2022-06-26 ENCOUNTER — Ambulatory Visit (INDEPENDENT_AMBULATORY_CARE_PROVIDER_SITE_OTHER): Payer: 59 | Admitting: Family

## 2022-06-26 ENCOUNTER — Other Ambulatory Visit: Payer: Self-pay | Admitting: Family

## 2022-06-26 VITALS — BP 118/62 | HR 88 | Temp 97.6°F | Ht 67.0 in | Wt 140.1 lb

## 2022-06-26 DIAGNOSIS — J4531 Mild persistent asthma with (acute) exacerbation: Secondary | ICD-10-CM

## 2022-06-26 MED ORDER — FLUTICASONE PROPIONATE HFA 110 MCG/ACT IN AERO: 2.0000 | INHALATION_SPRAY | Freq: Two times a day (BID) | RESPIRATORY_TRACT | 1 refills | Status: DC

## 2022-06-26 MED ORDER — PREDNISONE 20 MG PO TABS: 20.0000 mg | ORAL_TABLET | Freq: Every day | ORAL | 0 refills | Status: DC

## 2022-06-26 NOTE — Progress Notes (Signed)
Patient ID: Tammy Cunningham, female    DOB: 1985/09/25, 36 y.o.   MRN: [id number]  Chief Complaint  Patient presents with  . Cough    Follow up , Getting worse, did work after medications but came back.    HPI:      Persistent cough:  seen 10d ago in office & given pred pack w/cough syrup, she reports her sx started as a cold/sinus over a month ago, tested neg for covid at home, felt better for a week after stopping steroids but cough returned, with no other symptoms, states her Albuterol inhaler is helping some.   Assessment & Plan:  1. Asthma, cold induced, mild persistent, with acute exacerbation better after steroids but then cough restarted. sending low dose for 6 more days along with generic Flovent inhaler, advised on use & SE and to continue using Albuterol bid for next 5 days, tid if needed. Drink plenty of water.  - predniSONE (DELTASONE) 20 MG tablet; Take 1 tablet (20 mg total) by mouth daily with breakfast.  Dispense: 6 tablet; Refill: 0 - fluticasone (FLOVENT HFA) 110 MCG/ACT inhaler; Inhale 2 puffs into the lungs in the morning and at bedtime. Rinse mouth after using.  Dispense: 1 each; Refill: 1  Subjective:    Outpatient Medications Prior to Visit  Medication Sig Dispense Refill  . albuterol (VENTOLIN HFA) 108 (90 Base) MCG/ACT inhaler Inhale 1-2 puffs into the lungs every 6 (six) hours as needed for wheezing or shortness of breath.    . amphetamine-dextroamphetamine (ADDERALL XR) 15 MG 24 hr capsule Take 1 capsule by mouth every morning. 30 capsule 0  . amphetamine-dextroamphetamine (ADDERALL) 15 MG tablet Take 1 tablet by mouth daily. 30 tablet 0  . EPINEPHrine 0.3 mg/0.3 mL IJ SOAJ injection Inject into the muscle.    . escitalopram (LEXAPRO) 20 MG tablet Take 1 tablet (20 mg total) by mouth daily. 90 tablet 1  . fexofenadine (ALLEGRA) 180 MG tablet Take by mouth.    Marland Kitchen HYDROcodone bit-homatropine (HYCODAN) 5-1.5 MG/5ML syrup Take 5 mLs by mouth every 6 (six) hours as  needed for cough. 120 mL 0  . JUNEL 1/20 1-20 MG-MCG tablet Take by mouth.    . predniSONE (DELTASONE) 10 MG tablet Take in the mornings:  6 tab day 1, 5 tab day 2, 4 tab day 3-4, 3 tab day 5, 2 tab day 6 26 tablet 0  . Rimegepant Sulfate (NURTEC) 75 MG TBDP Take 1 tablet by mouth daily. 48 tablet 1  . rizatriptan (MAXALT) 10 MG tablet Take 10 mg by mouth as needed for migraine. May repeat in 2 hours if needed    . tiZANidine (ZANAFLEX) 2 MG tablet TAKE 1 TABLET BY MOUTH EVERYDAY AT BEDTIME 90 tablet 1  . topiramate (TOPAMAX) 50 MG tablet Take 1 tablet (50 mg total) by mouth 2 (two) times daily. 180 tablet 1  . amphetamine-dextroamphetamine (ADDERALL XR) 15 MG 24 hr capsule Take 1 capsule by mouth every morning. 30 capsule 0  . amphetamine-dextroamphetamine (ADDERALL XR) 15 MG 24 hr capsule Take 1 capsule by mouth every morning. 30 capsule 0  . amphetamine-dextroamphetamine (ADDERALL) 15 MG tablet Take 1 tablet by mouth daily before breakfast. 30 tablet 0  . amphetamine-dextroamphetamine (ADDERALL) 15 MG tablet Take 1 tablet by mouth daily before breakfast. 30 tablet 0   No facility-administered medications prior to visit.   Past Medical History:  Diagnosis Date  . Allergy   . Anxiety   . Asthma   .  Depression   . GAD (generalized anxiety disorder) 05/08/2015  . High risk medication use 01/21/2015  . Lipid screening 09/05/2020  . Major depression in complete remission (HCC) 05/08/2015  . Neck pain 05/27/2021  . Seizures (HCC)   . Unexplained weight loss 02/10/2013   Past Surgical History:  Procedure Laterality Date  . HIP SURGERY Right    2019  . OVARIAN CYST REMOVAL     2005   Allergies  Allergen Reactions  . Peanut Oil Anaphylaxis  . Amoxicillin Hives  . Depakote Er  [Divalproex Sodium Er] Other (See Comments)    Liver failure  . Milk (Cow) Other (See Comments)    Breathing problems  . Onion Other (See Comments)    breathing  . Paroxetine Other (See Comments)    nausea  .  Penicillins Hives  . Aspartame Nausea And Vomiting  . Lamotrigine     Other reaction(s): Loss of Consciousness Elevated LFTs  . Sucralose     Other reaction(s): GI Upset (intolerance)  . Tilactase     Other reaction(s): Respiratory Distress (ALLERGY/intolerance) wheezing  . Wheat Bran     Other reaction(s): Respiratory Distress (ALLERGY/intolerance)  . Eggs Or Egg-Derived Products Other (See Comments)      Objective:    Physical Exam Vitals and nursing note reviewed.  Constitutional:      Appearance: Normal appearance.  Cardiovascular:     Rate and Rhythm: Normal rate and regular rhythm.  Pulmonary:     Effort: Pulmonary effort is normal.     Breath sounds: Normal breath sounds.  Musculoskeletal:        General: Normal range of motion.  Skin:    General: Skin is warm and dry.  Neurological:     Mental Status: She is alert.  Psychiatric:        Mood and Affect: Mood normal.        Behavior: Behavior normal.   BP 118/62 (BP Location: Left Arm, Patient Position: Sitting, Cuff Size: Large)   Pulse 88   Temp 97.6 F (36.4 C) (Temporal)   Ht 5\' 7"  (1.702 m)   Wt 140 lb 2 oz (63.6 kg)   LMP  (LMP Unknown)   SpO2 99%   BMI 21.95 kg/m  Wt Readings from Last 3 Encounters:  06/26/22 140 lb 2 oz (63.6 kg)  06/15/22 138 lb 4 oz (62.7 kg)  04/06/22 142 lb 2 oz (64.5 kg)       06/06/22, NP

## 2022-06-26 NOTE — Patient Instructions (Signed)
It was very nice to see you today!   I will review your lab results via MyChart in a few days.   Have a great rest of the week!   PLEASE NOTE:  If you had any lab tests please let us know if you have not heard back within a few days. You may see your results on MyChart before we have a chance to review them but we will give you a call once they are reviewed by us. If we ordered any referrals today, please let us know if you have not heard from their office within the next week.    

## 2022-07-11 ENCOUNTER — Other Ambulatory Visit: Payer: Self-pay | Admitting: Family

## 2022-07-11 DIAGNOSIS — G43701 Chronic migraine without aura, not intractable, with status migrainosus: Secondary | ICD-10-CM

## 2022-07-17 DIAGNOSIS — R69 Illness, unspecified: Secondary | ICD-10-CM | POA: Diagnosis not present

## 2022-07-17 DIAGNOSIS — F900 Attention-deficit hyperactivity disorder, predominantly inattentive type: Secondary | ICD-10-CM | POA: Diagnosis not present

## 2022-07-20 ENCOUNTER — Ambulatory Visit (INDEPENDENT_AMBULATORY_CARE_PROVIDER_SITE_OTHER)
Admission: RE | Admit: 2022-07-20 | Discharge: 2022-07-20 | Disposition: A | Discharge status: Home / Self Care | Payer: 59 | Source: Ambulatory Visit | Attending: Family | Admitting: Family

## 2022-07-20 ENCOUNTER — Encounter: Payer: Self-pay | Admitting: Family

## 2022-07-20 ENCOUNTER — Ambulatory Visit (INDEPENDENT_AMBULATORY_CARE_PROVIDER_SITE_OTHER): Payer: 59 | Admitting: Family

## 2022-07-20 VITALS — BP 116/82 | HR 83 | Temp 98.0°F | Ht 67.0 in | Wt 144.6 lb

## 2022-07-20 DIAGNOSIS — R053 Chronic cough: Secondary | ICD-10-CM

## 2022-07-20 DIAGNOSIS — J4531 Mild persistent asthma with (acute) exacerbation: Secondary | ICD-10-CM

## 2022-07-20 DIAGNOSIS — F988 Other specified behavioral and emotional disorders with onset usually occurring in childhood and adolescence: Secondary | ICD-10-CM

## 2022-07-20 DIAGNOSIS — R69 Illness, unspecified: Secondary | ICD-10-CM | POA: Diagnosis not present

## 2022-07-20 MED ORDER — AMPHETAMINE-DEXTROAMPHETAMINE 15 MG PO TABS: 15.0000 mg | ORAL_TABLET | Freq: Every day | ORAL | 0 refills | Status: DC

## 2022-07-20 MED ORDER — AMPHETAMINE-DEXTROAMPHET ER 15 MG PO CP24: 15.0000 mg | ORAL_CAPSULE | ORAL | 0 refills | Status: DC

## 2022-07-20 MED ORDER — AMPHETAMINE-DEXTROAMPHETAMINE 15 MG PO TABS: 1.0000 | ORAL_TABLET | Freq: Every day | ORAL | 0 refills | Status: DC

## 2022-07-20 NOTE — Assessment & Plan Note (Signed)
chronic stable on 15mg ER Adderall & 15mg IR refilling today PDMP checked & verified f/u 3 mos 

## 2022-07-20 NOTE — Progress Notes (Signed)
Patient ID: Tammy Cunningham, female    DOB: 1986/01/19, 36 y.o.   MRN: [id number]  Chief Complaint  Patient presents with   Cough    Follow up, Pt states her cough has been better and not as often but still coughing.    ADHD   ADHD f/u: Medications helping target goals: Adderall ER & IR Regimen: daily Medication side effects/concerns: denies Weight: no change Sleep: denies issue Mood changes: denies Tics: denies Blood pressure, Weight, Pulse, Behavior reviewed: wnl.   Persistent cough:  seen originally 1 month ago in office & given pred pack w/cough syrup, she reports her sx started as a cold/sinus, tested neg for covid at home, felt better for a week after stopping steroids but cough returned, advised on last visit to continue Albuterol inhaler tid, added Flovent and low dose steroid pack for another 5d, pt unable to pick up Flovent inhaler due to cost. Still dry cough, no fever, no other sx, no SOB, no wheezing, states she starts coughing and can't stop and her chest starts hurting.  Assessment & Plan:   Problem List Items Addressed This Visit       Other   Attention deficit disorder (ADD) in adult - Primary    chronic stable on 15mg  ER Adderall & 15mg  IR refilling today PDMP checked & verified f/u 3 mos       Relevant Medications   amphetamine-dextroamphetamine (ADDERALL XR) 15 MG 24 hr capsule   amphetamine-dextroamphetamine (ADDERALL XR) 15 MG 24 hr capsule (Start on 08/19/2022)   amphetamine-dextroamphetamine (ADDERALL) 15 MG tablet   amphetamine-dextroamphetamine (ADDERALL) 15 MG tablet (Start on 08/19/2022)   amphetamine-dextroamphetamine (ADDERALL) 15 MG tablet (Start on 09/19/2022)   amphetamine-dextroamphetamine (ADDERALL XR) 15 MG 24 hr capsule (Start on 09/19/2022)   Other Visit Diagnoses     Persistent cough for 3 weeks or longer    - steroids did not help this time, could not afford the steroid inhaler (and sent the one pharmacy said was cheaper) pt can barely  talk without coughing, still dry cough, bronchial lung sounds, no wheezing. Ordering CXR and CBC today, may need to refer to Pulmonary, will call pharmacy and see if any other inhaler is covered under her plan.    Relevant Orders   DG Chest 2 View   CBC with Differential/Platelet   Subjective:    Outpatient Medications Prior to Visit  Medication Sig Dispense Refill   albuterol (VENTOLIN HFA) 108 (90 Base) MCG/ACT inhaler Inhale 1-2 puffs into the lungs every 6 (six) hours as needed for wheezing or shortness of breath.     EPINEPHrine 0.3 mg/0.3 mL IJ SOAJ injection Inject into the muscle.     escitalopram (LEXAPRO) 20 MG tablet Take 1 tablet (20 mg total) by mouth daily. 90 tablet 1   fexofenadine (ALLEGRA) 180 MG tablet Take by mouth.     HYDROcodone bit-homatropine (HYCODAN) 5-1.5 MG/5ML syrup Take 5 mLs by mouth every 6 (six) hours as needed for cough. 120 mL 0   JUNEL 1/20 1-20 MG-MCG tablet Take by mouth.     Rimegepant Sulfate (NURTEC) 75 MG TBDP Take 1 tablet by mouth daily. 48 tablet 1   rizatriptan (MAXALT) 10 MG tablet Take 10 mg by mouth as needed for migraine. May repeat in 2 hours if needed     tiZANidine (ZANAFLEX) 2 MG tablet TAKE 1 TABLET BY MOUTH EVERYDAY AT BEDTIME 90 tablet 1   topiramate (TOPAMAX) 50 MG tablet TAKE 1 TABLET BY MOUTH TWICE  A DAY 60 tablet 5   amphetamine-dextroamphetamine (ADDERALL) 15 MG tablet Take 1 tablet by mouth daily. 30 tablet 0   amphetamine-dextroamphetamine (ADDERALL XR) 15 MG 24 hr capsule Take 1 capsule by mouth every morning. 30 capsule 0   amphetamine-dextroamphetamine (ADDERALL XR) 15 MG 24 hr capsule Take 1 capsule by mouth every morning. 30 capsule 0   amphetamine-dextroamphetamine (ADDERALL XR) 15 MG 24 hr capsule Take 1 capsule by mouth every morning. 30 capsule 0   amphetamine-dextroamphetamine (ADDERALL) 15 MG tablet Take 1 tablet by mouth daily before breakfast. 30 tablet 0   amphetamine-dextroamphetamine (ADDERALL) 15 MG tablet Take  1 tablet by mouth daily before breakfast. 30 tablet 0   mometasone (ASMANEX, 60 METERED DOSES,) 220 MCG/ACT inhaler Inhale 1-2 puffs into the lungs 2 (two) times daily. 1 each 2   predniSONE (DELTASONE) 20 MG tablet Take 1 tablet (20 mg total) by mouth daily with breakfast. 6 tablet 0   No facility-administered medications prior to visit.   Past Medical History:  Diagnosis Date   Allergy    Anxiety    Asthma    Depression    GAD (generalized anxiety disorder) 05/08/2015   High risk medication use 01/21/2015   Lipid screening 09/05/2020   Major depression in complete remission ([facility]) 05/08/2015   Neck pain 05/27/2021   Seizures ([facility])    Unexplained weight loss 02/10/2013   Past Surgical History:  Procedure Laterality Date   HIP SURGERY Right    2019   OVARIAN CYST REMOVAL     2005   Allergies  Allergen Reactions   Peanut Oil Anaphylaxis   Amoxicillin Hives   Depakote Er  [Divalproex Sodium Er] Other (See Comments)    Liver failure   Milk (Cow) Other (See Comments)    Breathing problems   Onion Other (See Comments)    breathing   Paroxetine Other (See Comments)    nausea   Penicillins Hives   Aspartame Nausea And Vomiting   Lamotrigine     Other reaction(s): Loss of Consciousness Elevated LFTs   Sucralose     Other reaction(s): GI Upset (intolerance)   Tilactase     Other reaction(s): Respiratory Distress (ALLERGY/intolerance) wheezing   Wheat Bran     Other reaction(s): Respiratory Distress (ALLERGY/intolerance)   Eggs Or Egg-Derived Products Other (See Comments)      Objective:    Physical Exam Vitals and nursing note reviewed.  Constitutional:      Appearance: Normal appearance.  Cardiovascular:     Rate and Rhythm: Normal rate and regular rhythm.  Pulmonary:     Effort: Pulmonary effort is normal.     Breath sounds: Normal breath sounds.  Musculoskeletal:        General: Normal range of motion.  Skin:    General: Skin is warm and dry.  Neurological:      Mental Status: She is alert.  Psychiatric:        Mood and Affect: Mood normal.        Behavior: Behavior normal.    BP 116/82 (BP Location: Left Arm, Patient Position: Sitting, Cuff Size: Large)   Pulse 83   Temp 98 F (36.7 C) (Temporal)   Ht 5\' 7"  (1.702 m)   Wt 144 lb 9.6 oz (65.6 kg)   LMP  (LMP Unknown)   SpO2 100%   BMI 22.65 kg/m  Wt Readings from Last 3 Encounters:  07/20/22 144 lb 9.6 oz (65.6 kg)  06/26/22 140 lb 2 oz (  63.6 kg)  06/15/22 138 lb 4 oz (62.7 kg)       Dulce Sellar, NP

## 2022-07-21 LAB — CBC WITH DIFFERENTIAL/PLATELET
Basophils Absolute: 0 10*3/uL (ref 0.0–0.1)
Basophils Relative: 0.5 % (ref 0.0–3.0)
Eosinophils Absolute: 0.5 10*3/uL (ref 0.0–0.7)
Eosinophils Relative: 5.9 % — ABNORMAL HIGH (ref 0.0–5.0)
HCT: 44.3 % (ref 36.0–46.0)
Hemoglobin: 14.8 g/dL (ref 12.0–15.0)
Lymphocytes Relative: 21.5 % (ref 12.0–46.0)
Lymphs Abs: 1.8 10*3/uL (ref 0.7–4.0)
MCHC: 33.4 g/dL (ref 30.0–36.0)
MCV: 102.3 fl — ABNORMAL HIGH (ref 78.0–100.0)
Monocytes Absolute: 0.4 10*3/uL (ref 0.1–1.0)
Monocytes Relative: 4.9 % (ref 3.0–12.0)
Neutro Abs: 5.6 10*3/uL (ref 1.4–7.7)
Neutrophils Relative %: 67.2 % (ref 43.0–77.0)
Platelets: 298 10*3/uL (ref 150.0–400.0)
RBC: 4.33 Mil/uL (ref 3.87–5.11)
RDW: 12.7 % (ref 11.5–15.5)
WBC: 8.4 10*3/uL (ref 4.0–10.5)

## 2022-07-22 NOTE — Progress Notes (Signed)
Your blood count looks good, no sign of infection. Did you get the xray done yet?  We are checking with your pharmacy to see about any inhalers that might be covered under your insurance. We'll let you know. Any improvement in your cough?

## 2022-07-23 MED ORDER — PREDNISONE 20 MG PO TABS: 20.0000 mg | ORAL_TABLET | Freq: Every day | ORAL | 0 refills | Status: DC

## 2022-07-23 NOTE — Progress Notes (Signed)
Your xray indicates probable Asthma. I have sent a referral to our pulmonary office so you can get tested. We also are trying to see what inhalers are affordable for you. The fastest way to do this though, is for you to call or go online with your insurance company and ask about inhalers for Asthma and which ones are covered. But if you have a high deductible, this has to be paid first before insurance covers the cost of the inhaler. Let me know what you find out, in the meantime I have sent over a low dose steroid to take for 10 days if needed, or you can stop if feeling a little better.

## 2022-07-23 NOTE — Addendum Note (Signed)
Addended byDulce Sellar on: 07/23/2022 09:31 AM   Modules accepted: Orders

## 2022-07-30 ENCOUNTER — Encounter: Payer: Self-pay | Admitting: Family

## 2022-07-30 DIAGNOSIS — R053 Chronic cough: Secondary | ICD-10-CM

## 2022-07-30 MED ORDER — JUNEL 1/20 1-20 MG-MCG PO TABS: 1.0000 | ORAL_TABLET | Freq: Every day | ORAL | 3 refills | Status: DC

## 2022-08-14 DIAGNOSIS — F900 Attention-deficit hyperactivity disorder, predominantly inattentive type: Secondary | ICD-10-CM | POA: Diagnosis not present

## 2022-08-14 DIAGNOSIS — R69 Illness, unspecified: Secondary | ICD-10-CM | POA: Diagnosis not present

## 2022-08-23 ENCOUNTER — Other Ambulatory Visit: Payer: Self-pay | Admitting: Family

## 2022-08-23 DIAGNOSIS — F419 Anxiety disorder, unspecified: Secondary | ICD-10-CM

## 2022-09-21 ENCOUNTER — Other Ambulatory Visit: Payer: Self-pay | Admitting: Family

## 2022-09-21 DIAGNOSIS — G43009 Migraine without aura, not intractable, without status migrainosus: Secondary | ICD-10-CM | POA: Diagnosis not present

## 2022-09-21 DIAGNOSIS — G43701 Chronic migraine without aura, not intractable, with status migrainosus: Secondary | ICD-10-CM

## 2022-09-29 ENCOUNTER — Other Ambulatory Visit: Payer: Self-pay | Admitting: Family

## 2022-09-29 ENCOUNTER — Telehealth: Payer: Self-pay | Admitting: Family

## 2022-09-29 ENCOUNTER — Other Ambulatory Visit: Payer: Self-pay

## 2022-09-29 DIAGNOSIS — G43701 Chronic migraine without aura, not intractable, with status migrainosus: Secondary | ICD-10-CM

## 2022-09-29 MED ORDER — NURTEC 75 MG PO TBDP: 1.0000 | ORAL_TABLET | Freq: Every day | ORAL | 1 refills | Status: DC

## 2022-09-29 NOTE — Telephone Encounter (Signed)
  LAST APPOINTMENT DATE:  07/20/22  NEXT APPOINTMENT DATE:  MEDICATION:  Rimegepant Sulfate (NURTEC) 75 MG TBDP   Is the patient out of medication? Yes  PHARMACY:  [id number] - [place], [address] Phone: [phone]  Fax: [phone]      Let patient know to contact pharmacy at the end of the day to make sure medication is ready.  Please notify patient to allow 48-72 hours to process

## 2022-10-28 ENCOUNTER — Other Ambulatory Visit: Payer: Self-pay | Admitting: Family

## 2022-10-28 DIAGNOSIS — F32A Depression, unspecified: Secondary | ICD-10-CM

## 2022-11-02 ENCOUNTER — Encounter: Payer: Self-pay | Admitting: Family

## 2022-11-02 ENCOUNTER — Other Ambulatory Visit: Payer: Self-pay | Admitting: Family

## 2022-11-02 ENCOUNTER — Ambulatory Visit: Payer: 59 | Admitting: Family

## 2022-11-02 VITALS — BP 102/74 | HR 94 | Temp 97.7°F | Ht 67.0 in | Wt 143.2 lb

## 2022-11-02 DIAGNOSIS — F419 Anxiety disorder, unspecified: Secondary | ICD-10-CM | POA: Diagnosis not present

## 2022-11-02 DIAGNOSIS — F988 Other specified behavioral and emotional disorders with onset usually occurring in childhood and adolescence: Secondary | ICD-10-CM

## 2022-11-02 DIAGNOSIS — J3089 Other allergic rhinitis: Secondary | ICD-10-CM | POA: Diagnosis not present

## 2022-11-02 DIAGNOSIS — J4540 Moderate persistent asthma, uncomplicated: Secondary | ICD-10-CM

## 2022-11-02 DIAGNOSIS — J309 Allergic rhinitis, unspecified: Secondary | ICD-10-CM | POA: Insufficient documentation

## 2022-11-02 DIAGNOSIS — F32A Depression, unspecified: Secondary | ICD-10-CM

## 2022-11-02 MED ORDER — AMPHETAMINE-DEXTROAMPHETAMINE 15 MG PO TABS: 15.0000 mg | ORAL_TABLET | Freq: Every day | ORAL | 0 refills | Status: DC

## 2022-11-02 MED ORDER — FLUTICASONE PROPIONATE HFA 110 MCG/ACT IN AERO: 2.0000 | INHALATION_SPRAY | Freq: Two times a day (BID) | RESPIRATORY_TRACT | 5 refills | Status: DC

## 2022-11-02 MED ORDER — AMPHETAMINE-DEXTROAMPHETAMINE 15 MG PO TABS: 1.0000 | ORAL_TABLET | Freq: Every day | ORAL | 0 refills | Status: DC

## 2022-11-02 MED ORDER — AMPHETAMINE-DEXTROAMPHET ER 15 MG PO CP24: 15.0000 mg | ORAL_CAPSULE | ORAL | 0 refills | Status: DC

## 2022-11-02 MED ORDER — ESCITALOPRAM OXALATE 20 MG PO TABS: 20.0000 mg | ORAL_TABLET | Freq: Every day | ORAL | 1 refills | Status: DC

## 2022-11-02 MED ORDER — AMPHETAMINE-DEXTROAMPHET ER 15 MG PO CP24: 15.0000 mg | ORAL_CAPSULE | Freq: Every morning | ORAL | 0 refills | Status: DC

## 2022-11-02 MED ORDER — TRIAMCINOLONE ACETONIDE 55 MCG/ACT NA AERO: 1.0000 | INHALATION_SPRAY | Freq: Every day | NASAL | 2 refills | Status: AC

## 2022-11-02 NOTE — Assessment & Plan Note (Signed)
New cold sx overlapping with allergy sending Nasacort nasal spray, advised on use & SE, use saline nasal spray tid & prior to Nasacort continue Allegra qd f/u 3 mos or prn

## 2022-11-02 NOTE — Assessment & Plan Note (Signed)
chronic stable on Lexapro '20mg'$  qd sending refill today f/u in 6 mos

## 2022-11-02 NOTE — Assessment & Plan Note (Addendum)
chronic stable on '15mg'$  ER Adderall & '15mg'$  IR refilling today PDMP checked & verified f/u 3 mos

## 2022-11-02 NOTE — Progress Notes (Signed)
Patient ID: Tammy Cunningham, female    DOB: [birth year], 37 y.o.   MRN: [id number]  Chief Complaint  Patient presents with   Medication Refill   Fever   Asthma   Anxiety   Depression    HPI: Anxiety/depression:  pt has had for years, taking Lexapro '20mg'$  qd, controlling sx, tolerating med. Has had therapy in past.  Fever:  low grade, up to 100, mostly 99, normal today & no anti-pyretic meds, sinus congestion, drainage mostly clear, sometimes yellowish and cough, has seasonal allergies not typically this time of year; congestion causing ear congestion. Hx of bad cough, bronchospasm in last few months, sent to pulmonary but has not scheduled appt yet.  Asthma:  sx started towards end of last year with chronic cough after a respiratory illness that would not go away. Still had difficulty resolving after 2 rounds of steroids, abt, and albuterol inhaler. CXR indicated borderline hyperinflation, referral sent to Pulmonary, and started Flovent inhaler. Pt reports some relief w/Flovent, did not go to Pulmonary.  ADHD f/u: Medications helping target goals: Adderall ER & IR Regimen: daily Medication side effects/concerns: denies Weight: no change Sleep: denies issue Mood changes: denies Tics: denies Blood pressure, Weight, Pulse, Behavior reviewed: wnl.   Assessment & Plan:  Anxiety and depression Assessment & Plan: chronic stable on Lexapro '20mg'$  qd sending refill today f/u in 6 mos  Orders: -     Escitalopram Oxalate; Take 1 tablet (20 mg total) by mouth daily.  Dispense: 90 tablet; Refill: 1  Attention deficit disorder (ADD) in adult Assessment & Plan: chronic stable on '15mg'$  ER Adderall & '15mg'$  IR refilling today PDMP checked & verified f/u 3 mos  Orders: -     Amphetamine-Dextroamphet ER; Take 1 capsule by mouth every morning.  Dispense: 30 capsule; Refill: 0 -     Amphetamine-Dextroamphet ER; Take 1 capsule by mouth every morning.  Dispense: 30 capsule; Refill: 0 -      Amphetamine-Dextroamphetamine; Take 1 tablet by mouth daily after lunch.  Dispense: 30 tablet; Refill: 0 -     Amphetamine-Dextroamphetamine; Take 1 tablet by mouth daily after lunch.  Dispense: 30 tablet; Refill: 0 -     Amphetamine-Dextroamphetamine; Take 1 tablet by mouth daily before breakfast.  Dispense: 30 tablet; Refill: 0 -     Amphetamine-Dextroamphet ER; Take 1 capsule by mouth every morning.  Dispense: 30 capsule; Refill: 0  Allergic rhinitis due to other allergic trigger, unspecified seasonality Assessment & Plan: New cold sx overlapping with allergy sending Nasacort nasal spray, advised on use & SE, use saline nasal spray tid & prior to Nasacort continue Allegra qd f/u 3 mos or prn  Orders: -     Triamcinolone Acetonide; Place 1 spray into the nose daily. Start with 1 spray each side twice a day for 3 days, then reduce to daily.  Dispense: 1 each; Refill: 2  Moderate persistent asthma, unspecified whether complicated Assessment & Plan: New - per CXR & pt symptoms some relief with Flovent & Albuterol referred to Pulm last visit, pt has not sch appt yet refilling Flovent inhaler f/u 3 mos or prn  Orders: -     Fluticasone Propionate HFA; Inhale 2 puffs into the lungs in the morning and at bedtime. Rinse mouth after using.  Dispense: 1 each; Refill: 5   Subjective:    Outpatient Medications Prior to Visit  Medication Sig Dispense Refill   albuterol (VENTOLIN HFA) 108 (90 Base) MCG/ACT inhaler Inhale 1-2 puffs into the  lungs every 6 (six) hours as needed for wheezing or shortness of breath.     EPINEPHrine 0.3 mg/0.3 mL IJ SOAJ injection Inject into the muscle.     fexofenadine (ALLEGRA) 180 MG tablet Take by mouth.     JUNEL 1/20 1-20 MG-MCG tablet Take 1 tablet by mouth daily. 90 tablet 3   tiZANidine (ZANAFLEX) 2 MG tablet TAKE 1 TABLET BY MOUTH EVERYDAY AT BEDTIME 90 tablet 1   topiramate (TOPAMAX) 50 MG tablet TAKE 1 TABLET BY MOUTH TWICE A DAY 60 tablet 5    amphetamine-dextroamphetamine (ADDERALL) 15 MG tablet Take 1 tablet by mouth daily after lunch. 30 tablet 0   escitalopram (LEXAPRO) 20 MG tablet TAKE 1 TABLET BY MOUTH EVERY DAY 90 tablet 2   amphetamine-dextroamphetamine (ADDERALL XR) 15 MG 24 hr capsule Take 1 capsule by mouth every morning. 30 capsule 0   predniSONE (DELTASONE) 20 MG tablet Take 1 tablet (20 mg total) by mouth daily with breakfast. (Patient not taking: Reported on 11/02/2022) 10 tablet 0   Rimegepant Sulfate (NURTEC) 75 MG TBDP Take 1 tablet (75 mg total) by mouth daily. (Patient not taking: Reported on 11/02/2022) 48 tablet 1   rizatriptan (MAXALT) 10 MG tablet Take 10 mg by mouth as needed for migraine. May repeat in 2 hours if needed (Patient not taking: Reported on 11/02/2022)     amphetamine-dextroamphetamine (ADDERALL XR) 15 MG 24 hr capsule Take 1 capsule by mouth every morning. 30 capsule 0   amphetamine-dextroamphetamine (ADDERALL XR) 15 MG 24 hr capsule Take 1 capsule by mouth every morning. 30 capsule 0   amphetamine-dextroamphetamine (ADDERALL) 15 MG tablet Take 1 tablet by mouth daily after lunch. 30 tablet 0   amphetamine-dextroamphetamine (ADDERALL) 15 MG tablet Take 1 tablet by mouth daily before breakfast. 30 tablet 0   HYDROcodone bit-homatropine (HYCODAN) 5-1.5 MG/5ML syrup Take 5 mLs by mouth every 6 (six) hours as needed for cough. 120 mL 0   No facility-administered medications prior to visit.   Past Medical History:  Diagnosis Date   Allergy    Anxiety    Asthma    Depression    GAD (generalized anxiety disorder) 05/08/2015   High risk medication use 01/21/2015   Lipid screening 09/05/2020   Major depression in complete remission ([place]) 05/08/2015   Neck pain 05/27/2021   Seizures ([place])    Unexplained weight loss 02/10/2013   Past Surgical History:  Procedure Laterality Date   HIP SURGERY Right    2019   OVARIAN CYST REMOVAL     2005   Allergies  Allergen Reactions   Peanut Oil Anaphylaxis    Amoxicillin Hives   Depakote Er  [Divalproex Sodium Er] Other (See Comments)    Liver failure   Milk (Cow) Other (See Comments)    Breathing problems   Onion Other (See Comments)    breathing   Paroxetine Other (See Comments)    nausea   Penicillins Hives   Aspartame Nausea And Vomiting   Lamotrigine     Other reaction(s): Loss of Consciousness Elevated LFTs   Sucralose     Other reaction(s): GI Upset (intolerance)   Tilactase     Other reaction(s): Respiratory Distress (ALLERGY/intolerance) wheezing   Wheat Bran     Other reaction(s): Respiratory Distress (ALLERGY/intolerance)   Eggs Or Egg-Derived Products Other (See Comments)      Objective:    Physical Exam Vitals and nursing note reviewed.  Constitutional:      Appearance: Normal appearance.  Cardiovascular:     Rate and Rhythm: Normal rate and regular rhythm.  Pulmonary:     Effort: Pulmonary effort is normal.     Breath sounds: Normal breath sounds.  Musculoskeletal:        General: Normal range of motion.  Skin:    General: Skin is warm and dry.  Neurological:     Mental Status: She is alert.  Psychiatric:        Mood and Affect: Mood normal.        Behavior: Behavior normal.    BP 102/74 (BP Location: Right Arm)   Pulse 94   Temp 97.7 F (36.5 C) (Temporal)   Ht '5\' 7"'$  (1.702 m)   Wt 143 lb 3.2 oz (65 kg)   SpO2 100%   BMI 22.43 kg/m  Wt Readings from Last 3 Encounters:  11/02/22 143 lb 3.2 oz (65 kg)  07/20/22 144 lb 9.6 oz (65.6 kg)  06/26/22 140 lb 2 oz (63.6 kg)      Jeanie Sewer, NP

## 2022-11-02 NOTE — Assessment & Plan Note (Signed)
New - per CXR & pt symptoms some relief with Flovent & Albuterol referred to Pulm last visit, pt has not sch appt yet refilling Flovent inhaler f/u 3 mos or prn

## 2022-11-05 DIAGNOSIS — G43919 Migraine, unspecified, intractable, without status migrainosus: Secondary | ICD-10-CM | POA: Diagnosis not present

## 2022-11-30 ENCOUNTER — Ambulatory Visit: Payer: 59 | Admitting: Family

## 2022-11-30 ENCOUNTER — Other Ambulatory Visit: Payer: Self-pay | Admitting: Family

## 2022-11-30 DIAGNOSIS — G43701 Chronic migraine without aura, not intractable, with status migrainosus: Secondary | ICD-10-CM

## 2022-11-30 MED ORDER — NURTEC 75 MG PO TBDP: 1.0000 | ORAL_TABLET | Freq: Every day | ORAL | 1 refills | Status: DC

## 2022-11-30 NOTE — Assessment & Plan Note (Signed)
Chronic  Topamax for preventative, pt prefers Nurtec for acute, works better & less SE than Imitrex & Topamax ins has told her we need to send PA for the Nurtec & she can still use a coupon to lower the cost. sending refill for Nurtec & requesting pharmacy team to start the PA f/u 6 mos or prn

## 2022-11-30 NOTE — Progress Notes (Signed)
Patient ID: Stephaney Dicostanzo, female    DOB: 06-06-1986, 37 y.o.   MRN: [id number]  Chief Complaint  Patient presents with  . Migraine    Discuss Nurtec    HPI: Migraines:  HX from last visit:  pt reports having a migraine about 2 times per month and last a few days, unless taking Nurtec, then it lasts 1 day. Uses Topamax preventatively, Maxalt for acute, prefers Nurtec but not covered with current insurance. Reports she is going to try and use an online coupon for the Nurtec. *Update: today pt reports she has been unable to get the Nurtec. She has been taking a combo of her Zanaflex, zofran, benadryl and prednisone. She has had toradol pills in the past. She has not tried injectables for prevention, but is taking the Topamax qd.    Assessment & Plan:  There are no diagnoses linked to this encounter.  Subjective:    Outpatient Medications Prior to Visit  Medication Sig Dispense Refill  . albuterol (VENTOLIN HFA) 108 (90 Base) MCG/ACT inhaler Inhale 1-2 puffs into the lungs every 6 (six) hours as needed for wheezing or shortness of breath.    . amphetamine-dextroamphetamine (ADDERALL XR) 15 MG 24 hr capsule Take 1 capsule by mouth every morning. 30 capsule 0  . [START ON 12/03/2022] amphetamine-dextroamphetamine (ADDERALL XR) 15 MG 24 hr capsule Take 1 capsule by mouth every morning. 30 capsule 0  . [START ON 01/02/2023] amphetamine-dextroamphetamine (ADDERALL XR) 15 MG 24 hr capsule Take 1 capsule by mouth every morning. 30 capsule 0  . [START ON 12/03/2022] amphetamine-dextroamphetamine (ADDERALL) 15 MG tablet Take 1 tablet by mouth daily after lunch. 30 tablet 0  . amphetamine-dextroamphetamine (ADDERALL) 15 MG tablet Take 1 tablet by mouth daily after lunch. 30 tablet 0  . [START ON 01/02/2023] amphetamine-dextroamphetamine (ADDERALL) 15 MG tablet Take 1 tablet by mouth daily before breakfast. 30 tablet 0  . beclomethasone (QVAR REDIHALER) 40 MCG/ACT inhaler Inhale 2 puffs into the lungs 2 (two)  times daily. 10.6 g 5  . EPINEPHrine 0.3 mg/0.3 mL IJ SOAJ injection Inject into the muscle.    . escitalopram (LEXAPRO) 20 MG tablet Take 1 tablet (20 mg total) by mouth daily. 90 tablet 1  . fexofenadine (ALLEGRA) 180 MG tablet Take by mouth.    Lenda Kelp 1/20 1-20 MG-MCG tablet Take 1 tablet by mouth daily. 90 tablet 3  . tiZANidine (ZANAFLEX) 2 MG tablet TAKE 1 TABLET BY MOUTH EVERYDAY AT BEDTIME 90 tablet 1  . topiramate (TOPAMAX) 50 MG tablet TAKE 1 TABLET BY MOUTH TWICE A DAY 60 tablet 5  . triamcinolone (NASACORT) 55 MCG/ACT AERO nasal inhaler Place 1 spray into the nose daily. Start with 1 spray each side twice a day for 3 days, then reduce to daily. 1 each 2   No facility-administered medications prior to visit.   Past Medical History:  Diagnosis Date  . Allergy   . Anxiety   . Asthma   . Depression   . GAD (generalized anxiety disorder) 05/08/2015  . High risk medication use 01/21/2015  . Lipid screening 09/05/2020  . Major depression in complete remission 05/08/2015  . Neck pain 05/27/2021  . Seizures   . Unexplained weight loss 02/10/2013   Past Surgical History:  Procedure Laterality Date  . HIP SURGERY Right    2019  . OVARIAN CYST REMOVAL     2005   Allergies  Allergen Reactions  . Peanut Oil Anaphylaxis  . Amoxicillin Hives  .  Depakote Er  [Divalproex Sodium Er] Other (See Comments)    Liver failure  . Milk (Cow) Other (See Comments)    Breathing problems  . Onion Other (See Comments)    breathing  . Paroxetine Other (See Comments)    nausea  . Penicillins Hives  . Aspartame Nausea And Vomiting  . Lamotrigine     Other reaction(s): Loss of Consciousness Elevated LFTs  . Sucralose     Other reaction(s): GI Upset (intolerance)  . Tilactase     Other reaction(s): Respiratory Distress (ALLERGY/intolerance) wheezing  . Wheat     Other reaction(s): Respiratory Distress (ALLERGY/intolerance)  . Egg-Derived Products Other (See Comments)      Objective:     Physical Exam Vitals and nursing note reviewed.  Constitutional:      Appearance: Normal appearance.  Cardiovascular:     Rate and Rhythm: Normal rate and regular rhythm.  Pulmonary:     Effort: Pulmonary effort is normal.     Breath sounds: Normal breath sounds.  Musculoskeletal:        General: Normal range of motion.  Skin:    General: Skin is warm and dry.  Neurological:     Mental Status: She is alert.  Psychiatric:        Mood and Affect: Mood normal.        Behavior: Behavior normal.   BP 114/72 (BP Location: Left Arm, Patient Position: Sitting, Cuff Size: Large)   Pulse 88   Temp 97.8 F (36.6 C) (Temporal)   Ht 5\' 7"  (1.702 m)   Wt 140 lb 12.8 oz (63.9 kg)   SpO2 100%   BMI 22.05 kg/m  Wt Readings from Last 3 Encounters:  11/30/22 140 lb 12.8 oz (63.9 kg)  11/02/22 143 lb 3.2 oz (65 kg)  07/20/22 144 lb 9.6 oz (65.6 kg)       Jeanie Sewer, NP

## 2022-12-01 ENCOUNTER — Other Ambulatory Visit: Payer: Self-pay | Admitting: Family

## 2022-12-01 DIAGNOSIS — G43701 Chronic migraine without aura, not intractable, with status migrainosus: Secondary | ICD-10-CM

## 2022-12-01 MED ORDER — NURTEC 75 MG PO TBDP: 1.0000 | ORAL_TABLET | Freq: Every day | ORAL | 1 refills | Status: AC

## 2022-12-02 ENCOUNTER — Other Ambulatory Visit (HOSPITAL_COMMUNITY): Payer: Self-pay

## 2022-12-07 ENCOUNTER — Other Ambulatory Visit (HOSPITAL_COMMUNITY): Payer: Self-pay

## 2022-12-14 ENCOUNTER — Telehealth: Payer: Self-pay

## 2022-12-14 NOTE — Telephone Encounter (Signed)
Patient Advocate Encounter  Received a fax from [facility] regarding Prior Authorization for Nurtec ODT 75mg  .   Authorization has been DENIED due to    Determination letter attached to patient chart

## 2022-12-15 NOTE — Telephone Encounter (Signed)
I believe she said Ubrelvy tabs did not work for her. Please confirm when she calls back. Also ask if she has tried [place]? This is in the same class as Ubrelvy & Nurtec but it is newer. I can try to send that in if she would like. Let me know, thx.

## 2022-12-21 NOTE — Telephone Encounter (Signed)
Monia Pouch would like a call back about pt PA. They needs last office notes. Fax#[phone] Lelon Mast - [employer]

## 2022-12-22 NOTE — Telephone Encounter (Signed)
Faxed

## 2022-12-29 NOTE — Telephone Encounter (Signed)
-----   Message from Dulce Sellar, NP sent at 12/29/2022  3:35 PM EDT ----- Regarding: Migraine medication Did you ever reach [facility] about below msg?  I believe she said Ubrelvy tabs did not work for her. Please confirm. Also ask if she has tried [place]? This is in the same class as Ubrelvy & Nurtec but it is newer. I can try to send that in if she would like. Let me know, thx.

## 2022-12-29 NOTE — Telephone Encounter (Signed)
I tried reaching out to pt, unable to leave a vm due to vm box being full.

## 2023-02-01 DIAGNOSIS — M9905 Segmental and somatic dysfunction of pelvic region: Secondary | ICD-10-CM | POA: Diagnosis not present

## 2023-02-01 DIAGNOSIS — M9902 Segmental and somatic dysfunction of thoracic region: Secondary | ICD-10-CM | POA: Diagnosis not present

## 2023-02-01 DIAGNOSIS — M9903 Segmental and somatic dysfunction of lumbar region: Secondary | ICD-10-CM | POA: Diagnosis not present

## 2023-02-01 DIAGNOSIS — M9901 Segmental and somatic dysfunction of cervical region: Secondary | ICD-10-CM | POA: Diagnosis not present

## 2023-02-02 DIAGNOSIS — J029 Acute pharyngitis, unspecified: Secondary | ICD-10-CM | POA: Diagnosis not present

## 2023-02-05 DIAGNOSIS — M9902 Segmental and somatic dysfunction of thoracic region: Secondary | ICD-10-CM | POA: Diagnosis not present

## 2023-02-05 DIAGNOSIS — M9901 Segmental and somatic dysfunction of cervical region: Secondary | ICD-10-CM | POA: Diagnosis not present

## 2023-02-05 DIAGNOSIS — M9905 Segmental and somatic dysfunction of pelvic region: Secondary | ICD-10-CM | POA: Diagnosis not present

## 2023-02-05 DIAGNOSIS — M9903 Segmental and somatic dysfunction of lumbar region: Secondary | ICD-10-CM | POA: Diagnosis not present

## 2023-02-09 DIAGNOSIS — M9902 Segmental and somatic dysfunction of thoracic region: Secondary | ICD-10-CM | POA: Diagnosis not present

## 2023-02-09 DIAGNOSIS — M9901 Segmental and somatic dysfunction of cervical region: Secondary | ICD-10-CM | POA: Diagnosis not present

## 2023-02-09 DIAGNOSIS — M9903 Segmental and somatic dysfunction of lumbar region: Secondary | ICD-10-CM | POA: Diagnosis not present

## 2023-02-09 DIAGNOSIS — M9905 Segmental and somatic dysfunction of pelvic region: Secondary | ICD-10-CM | POA: Diagnosis not present

## 2023-02-12 DIAGNOSIS — M9901 Segmental and somatic dysfunction of cervical region: Secondary | ICD-10-CM | POA: Diagnosis not present

## 2023-02-12 DIAGNOSIS — M9903 Segmental and somatic dysfunction of lumbar region: Secondary | ICD-10-CM | POA: Diagnosis not present

## 2023-02-12 DIAGNOSIS — M9905 Segmental and somatic dysfunction of pelvic region: Secondary | ICD-10-CM | POA: Diagnosis not present

## 2023-02-12 DIAGNOSIS — M9902 Segmental and somatic dysfunction of thoracic region: Secondary | ICD-10-CM | POA: Diagnosis not present

## 2023-02-15 ENCOUNTER — Ambulatory Visit (INDEPENDENT_AMBULATORY_CARE_PROVIDER_SITE_OTHER): Payer: 59 | Admitting: Family

## 2023-02-15 VITALS — BP 111/77 | HR 90 | Temp 95.0°F | Ht 67.0 in | Wt 143.4 lb

## 2023-02-15 DIAGNOSIS — N926 Irregular menstruation, unspecified: Secondary | ICD-10-CM | POA: Diagnosis not present

## 2023-02-15 DIAGNOSIS — G43701 Chronic migraine without aura, not intractable, with status migrainosus: Secondary | ICD-10-CM

## 2023-02-15 DIAGNOSIS — F988 Other specified behavioral and emotional disorders with onset usually occurring in childhood and adolescence: Secondary | ICD-10-CM | POA: Diagnosis not present

## 2023-02-15 MED ORDER — AMPHETAMINE-DEXTROAMPHET ER 15 MG PO CP24
15.0000 mg | ORAL_CAPSULE | ORAL | 0 refills | Status: DC
Start: 2023-03-17 — End: 2023-05-11

## 2023-02-15 MED ORDER — JUNEL 1/20 1-20 MG-MCG PO TABS: 1.0000 | ORAL_TABLET | Freq: Every day | ORAL | 3 refills | Status: AC

## 2023-02-15 MED ORDER — AMPHETAMINE-DEXTROAMPHETAMINE 15 MG PO TABS: 1.0000 | ORAL_TABLET | Freq: Every day | ORAL | 0 refills | Status: DC

## 2023-02-15 MED ORDER — UBRELVY 50 MG PO TABS
50.0000 mg | ORAL_TABLET | Freq: Once | ORAL | 5 refills | Status: AC
Start: 2023-02-15 — End: 2023-02-15

## 2023-02-15 MED ORDER — AMPHETAMINE-DEXTROAMPHET ER 15 MG PO CP24
15.0000 mg | ORAL_CAPSULE | Freq: Every morning | ORAL | 0 refills | Status: DC
Start: 2023-02-15 — End: 2023-05-11

## 2023-02-15 MED ORDER — AMPHETAMINE-DEXTROAMPHET ER 15 MG PO CP24: 15.0000 mg | ORAL_CAPSULE | ORAL | 0 refills | Status: DC

## 2023-02-15 MED ORDER — AMPHETAMINE-DEXTROAMPHETAMINE 15 MG PO TABS
15.0000 mg | ORAL_TABLET | Freq: Every day | ORAL | 0 refills | Status: AC
Start: 2023-03-27 — End: 2024-06-23

## 2023-02-15 MED ORDER — AMPHETAMINE-DEXTROAMPHETAMINE 15 MG PO TABS
15.0000 mg | ORAL_TABLET | Freq: Every day | ORAL | 0 refills | Status: DC
Start: 2023-04-27 — End: 2023-05-11

## 2023-02-15 NOTE — Progress Notes (Signed)
Patient ID: Tammy Cunningham, female    DOB: 1985/12/22, 37 y.o.   MRN: [id number]  Chief Complaint  Patient presents with   Anxiety   Migraine    HPI: ADHD f/u: Medications helping target goals: Adderall 15mg  ER & IR Regimen: daily Medication side effects/concerns: denies Weight: no change Sleep: denies issue Mood changes: denies Tics: denies Blood pressure, Weight, Pulse, Behavior reviewed: wnl.  Migraines:  HX from last visit:  pt reports having a migraine about 2 times per month and last a few days, unless taking Nurtec, then it lasts 1 day. Uses Topamax preventatively, Maxalt for acute, prefers Nurtec but not covered with current insurance. Reports she is going to try and use an online coupon for the Nurtec. Last visit  pt reported she had been unable to get the Nurtec. She has been taking a combo of her Zanaflex, zofran, benadryl and prednisone. She has had toradol pills in the past. She has not tried injectables for prevention, but is taking the Topamax qd. We sent a prior auth for the Nurtec but her ins. rejected stating she needed to try [place] or [place] first. We left several messages for pt but did not hear back, pt here today to discuss options.    Assessment & Plan:  Chronic migraine without aura with status migrainosus, not intractable Assessment & Plan: Chronic  Topamax for preventative, pt prefers Nurtec for acute, works better & less SE than Imitrex & Topamax ins refused the Nurtec, wants pt to try different med first, we tried calling pt but could not reach her ins. prefers [place] or [place], samples given today for Ubrelvy 50mg  & 100mg , advised on use & SE sending Ubrelvy 50mg  dose to pharmacy today pt to let me know if not working via [employer] f/u 6 mos or prn  Orders: Bernita Raisin; Take 1-2 tablets (50-100 mg total) by mouth once for 1 dose. Max dose of 200mg  in 24 hours.  Dispense: 20 tablet; Refill: 5  Attention deficit disorder (ADD) in adult Assessment &  Plan: chronic stable on 15mg  ER Adderall & 15mg  IR printing RX refills today as pt is still having a hard time either finding the medication or the pharmacy tells her they don't have refills on file PDMP checked & verified f/u 3 mos  Orders: -     Amphetamine-Dextroamphet ER; Take 1 capsule by mouth every morning.  Dispense: 30 capsule; Refill: 0 -     Amphetamine-Dextroamphetamine; Take 1 tablet by mouth daily after lunch.  Dispense: 30 tablet; Refill: 0 -     Amphetamine-Dextroamphetamine; Take 1 tablet by mouth daily after lunch.  Dispense: 30 tablet; Refill: 0 -     Amphetamine-Dextroamphetamine; Take 1 tablet by mouth daily before breakfast.  Dispense: 30 tablet; Refill: 0 -     Amphetamine-Dextroamphet ER; Take 1 capsule by mouth every morning.  Dispense: 30 capsule; Refill: 0 -     Amphetamine-Dextroamphet ER; Take 1 capsule by mouth every morning.  Dispense: 30 capsule; Refill: 0  Menstrual cycle disorder -     Junel 1/20; Take 1 tablet by mouth daily.  Dispense: 90 tablet; Refill: 3   Subjective:    Outpatient Medications Prior to Visit  Medication Sig Dispense Refill   albuterol (VENTOLIN HFA) 108 (90 Base) MCG/ACT inhaler Inhale 1-2 puffs into the lungs every 6 (six) hours as needed for wheezing or shortness of breath.     beclomethasone (QVAR REDIHALER) 40 MCG/ACT inhaler Inhale 2 puffs  into the lungs 2 (two) times daily. 10.6 g 5   EPINEPHrine 0.3 mg/0.3 mL IJ SOAJ injection Inject into the muscle.     escitalopram (LEXAPRO) 20 MG tablet Take 1 tablet (20 mg total) by mouth daily. 90 tablet 1   fexofenadine (ALLEGRA) 180 MG tablet Take by mouth.     NURTEC 75 MG TBDP TAKE 1 TABLET BY MOUTH EVERY DAY 48 tablet 1   Rimegepant Sulfate (NURTEC) 75 MG TBDP Take 1 tablet (75 mg total) by mouth daily. 48 tablet 1   tiZANidine (ZANAFLEX) 2 MG tablet TAKE 1 TABLET BY MOUTH EVERYDAY AT BEDTIME 90 tablet 1   topiramate (TOPAMAX) 50 MG tablet TAKE 1 TABLET BY MOUTH TWICE A DAY 60  tablet 5   amphetamine-dextroamphetamine (ADDERALL XR) 15 MG 24 hr capsule Take 1 capsule by mouth every morning. 30 capsule 0   amphetamine-dextroamphetamine (ADDERALL) 15 MG tablet Take 1 tablet by mouth daily after lunch. 30 tablet 0   JUNEL 1/20 1-20 MG-MCG tablet Take 1 tablet by mouth daily. 90 tablet 3   triamcinolone (NASACORT) 55 MCG/ACT AERO nasal inhaler Place 1 spray into the nose daily. Start with 1 spray each side twice a day for 3 days, then reduce to daily. (Patient not taking: Reported on 02/15/2023) 1 each 2   amphetamine-dextroamphetamine (ADDERALL XR) 15 MG 24 hr capsule Take 1 capsule by mouth every morning. 30 capsule 0   amphetamine-dextroamphetamine (ADDERALL XR) 15 MG 24 hr capsule Take 1 capsule by mouth every morning. 30 capsule 0   amphetamine-dextroamphetamine (ADDERALL) 15 MG tablet Take 1 tablet by mouth daily after lunch. 30 tablet 0   amphetamine-dextroamphetamine (ADDERALL) 15 MG tablet Take 1 tablet by mouth daily before breakfast. 30 tablet 0   No facility-administered medications prior to visit.   Past Medical History:  Diagnosis Date   Allergy    Anxiety    Asthma    Depression    GAD (generalized anxiety disorder) 05/08/2015   High risk medication use 01/21/2015   Lipid screening 09/05/2020   Major depression in complete remission ([facility]) 05/08/2015   Neck pain 05/27/2021   Seizures ([facility])    Unexplained weight loss 02/10/2013   Past Surgical History:  Procedure Laterality Date   HIP SURGERY Right    2019   OVARIAN CYST REMOVAL     2005   Allergies  Allergen Reactions   Peanut Oil Anaphylaxis   Amoxicillin Hives   Depakote Er  [Divalproex Sodium Er] Other (See Comments)    Liver failure   Milk (Cow) Other (See Comments)    Breathing problems   Onion Other (See Comments)    breathing   Paroxetine Other (See Comments)    nausea   Penicillins Hives   Aspartame Nausea And Vomiting   Lamotrigine     Other reaction(s): Loss of  Consciousness Elevated LFTs   Sucralose     Other reaction(s): GI Upset (intolerance)   Tilactase     Other reaction(s): Respiratory Distress (ALLERGY/intolerance) wheezing   Wheat     Other reaction(s): Respiratory Distress (ALLERGY/intolerance)   Egg-Derived Products Other (See Comments)      Objective:    Physical Exam Vitals and nursing note reviewed.  Constitutional:      Appearance: Normal appearance.  Cardiovascular:     Rate and Rhythm: Normal rate and regular rhythm.  Pulmonary:     Effort: Pulmonary effort is normal.     Breath sounds: Normal breath sounds.  Musculoskeletal:  General: Normal range of motion.  Skin:    General: Skin is warm and dry.  Neurological:     Mental Status: She is alert.  Psychiatric:        Mood and Affect: Mood normal.        Behavior: Behavior normal.    BP 111/77   Pulse 90   Temp (!) 95 F (35 C) (Temporal)   Ht 5\' 7"  (1.702 m)   Wt 143 lb 6.4 oz (65 kg)   SpO2 100%   BMI 22.46 kg/m  Wt Readings from Last 3 Encounters:  02/15/23 143 lb 6.4 oz (65 kg)  11/30/22 140 lb 12.8 oz (63.9 kg)  11/02/22 143 lb 3.2 oz (65 kg)      Dulce Sellar, NP

## 2023-02-15 NOTE — Assessment & Plan Note (Addendum)
chronic stable on 15mg  ER Adderall & 15mg  IR printing RX refills today as pt is still having a hard time either finding the medication or the pharmacy tells her they don't have refills on file PDMP checked & verified f/u 3 mos

## 2023-02-15 NOTE — Assessment & Plan Note (Addendum)
Chronic  Topamax for preventative, pt prefers Nurtec for acute, works better & less SE than Imitrex & Topamax ins refused the Nurtec, wants pt to try different med first, we tried calling pt but could not reach her ins. prefers [place] or [place], samples given today for Ubrelvy 50mg  & 100mg , advised on use & SE sending Ubrelvy 50mg  dose to pharmacy today pt to let me know if not working via [employer] f/u 6 mos or prn

## 2023-02-16 DIAGNOSIS — M9903 Segmental and somatic dysfunction of lumbar region: Secondary | ICD-10-CM | POA: Diagnosis not present

## 2023-02-16 DIAGNOSIS — M9901 Segmental and somatic dysfunction of cervical region: Secondary | ICD-10-CM | POA: Diagnosis not present

## 2023-02-16 DIAGNOSIS — M9902 Segmental and somatic dysfunction of thoracic region: Secondary | ICD-10-CM | POA: Diagnosis not present

## 2023-02-16 DIAGNOSIS — M9905 Segmental and somatic dysfunction of pelvic region: Secondary | ICD-10-CM | POA: Diagnosis not present

## 2023-02-18 ENCOUNTER — Telehealth: Payer: Self-pay

## 2023-02-18 DIAGNOSIS — M9901 Segmental and somatic dysfunction of cervical region: Secondary | ICD-10-CM | POA: Diagnosis not present

## 2023-02-18 DIAGNOSIS — M9903 Segmental and somatic dysfunction of lumbar region: Secondary | ICD-10-CM | POA: Diagnosis not present

## 2023-02-18 DIAGNOSIS — M9902 Segmental and somatic dysfunction of thoracic region: Secondary | ICD-10-CM | POA: Diagnosis not present

## 2023-02-18 DIAGNOSIS — M9905 Segmental and somatic dysfunction of pelvic region: Secondary | ICD-10-CM | POA: Diagnosis not present

## 2023-02-18 NOTE — Telephone Encounter (Signed)
Bernita Raisin approved through 02/17/2024     Approval letter attached to charts

## 2023-02-23 DIAGNOSIS — M9903 Segmental and somatic dysfunction of lumbar region: Secondary | ICD-10-CM | POA: Diagnosis not present

## 2023-02-23 DIAGNOSIS — M9905 Segmental and somatic dysfunction of pelvic region: Secondary | ICD-10-CM | POA: Diagnosis not present

## 2023-02-23 DIAGNOSIS — M9901 Segmental and somatic dysfunction of cervical region: Secondary | ICD-10-CM | POA: Diagnosis not present

## 2023-02-23 DIAGNOSIS — M9902 Segmental and somatic dysfunction of thoracic region: Secondary | ICD-10-CM | POA: Diagnosis not present

## 2023-03-02 DIAGNOSIS — M9902 Segmental and somatic dysfunction of thoracic region: Secondary | ICD-10-CM | POA: Diagnosis not present

## 2023-03-02 DIAGNOSIS — M9903 Segmental and somatic dysfunction of lumbar region: Secondary | ICD-10-CM | POA: Diagnosis not present

## 2023-03-02 DIAGNOSIS — M9901 Segmental and somatic dysfunction of cervical region: Secondary | ICD-10-CM | POA: Diagnosis not present

## 2023-03-02 DIAGNOSIS — M9905 Segmental and somatic dysfunction of pelvic region: Secondary | ICD-10-CM | POA: Diagnosis not present

## 2023-03-03 DIAGNOSIS — M9905 Segmental and somatic dysfunction of pelvic region: Secondary | ICD-10-CM | POA: Diagnosis not present

## 2023-03-03 DIAGNOSIS — M9902 Segmental and somatic dysfunction of thoracic region: Secondary | ICD-10-CM | POA: Diagnosis not present

## 2023-03-03 DIAGNOSIS — M9903 Segmental and somatic dysfunction of lumbar region: Secondary | ICD-10-CM | POA: Diagnosis not present

## 2023-03-03 DIAGNOSIS — M9901 Segmental and somatic dysfunction of cervical region: Secondary | ICD-10-CM | POA: Diagnosis not present

## 2023-03-09 DIAGNOSIS — M9903 Segmental and somatic dysfunction of lumbar region: Secondary | ICD-10-CM | POA: Diagnosis not present

## 2023-03-09 DIAGNOSIS — M9901 Segmental and somatic dysfunction of cervical region: Secondary | ICD-10-CM | POA: Diagnosis not present

## 2023-03-09 DIAGNOSIS — M9905 Segmental and somatic dysfunction of pelvic region: Secondary | ICD-10-CM | POA: Diagnosis not present

## 2023-03-09 DIAGNOSIS — M9902 Segmental and somatic dysfunction of thoracic region: Secondary | ICD-10-CM | POA: Diagnosis not present

## 2023-03-12 DIAGNOSIS — M9902 Segmental and somatic dysfunction of thoracic region: Secondary | ICD-10-CM | POA: Diagnosis not present

## 2023-03-12 DIAGNOSIS — M9903 Segmental and somatic dysfunction of lumbar region: Secondary | ICD-10-CM | POA: Diagnosis not present

## 2023-03-12 DIAGNOSIS — M9905 Segmental and somatic dysfunction of pelvic region: Secondary | ICD-10-CM | POA: Diagnosis not present

## 2023-03-12 DIAGNOSIS — M9901 Segmental and somatic dysfunction of cervical region: Secondary | ICD-10-CM | POA: Diagnosis not present

## 2023-03-16 DIAGNOSIS — M9901 Segmental and somatic dysfunction of cervical region: Secondary | ICD-10-CM | POA: Diagnosis not present

## 2023-03-16 DIAGNOSIS — M9903 Segmental and somatic dysfunction of lumbar region: Secondary | ICD-10-CM | POA: Diagnosis not present

## 2023-03-16 DIAGNOSIS — M9902 Segmental and somatic dysfunction of thoracic region: Secondary | ICD-10-CM | POA: Diagnosis not present

## 2023-03-16 DIAGNOSIS — M9905 Segmental and somatic dysfunction of pelvic region: Secondary | ICD-10-CM | POA: Diagnosis not present

## 2023-03-18 IMAGING — XA DG FLUORO GUIDE NDL PLC/BX
3 series · 3 of 3 positions shown · non-contrast
Comparison: none

CLINICAL DATA: Hip injury with pain

[Series 1: ortho adipose · 1 of 1 slices shown (1 of 3)]
[im 1/1]
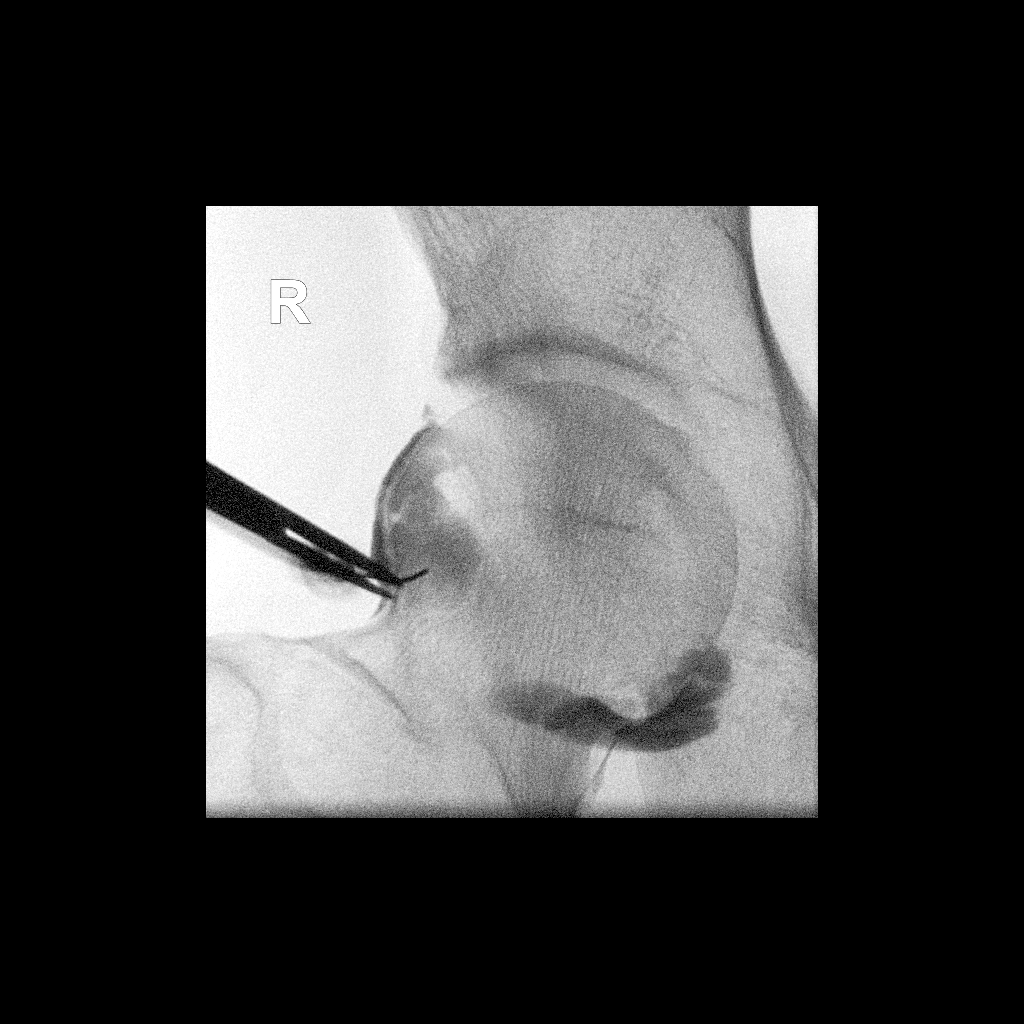

[Series 2: ortho adipose · 1 of 1 slices shown (2 of 3)]
[im 1/1]
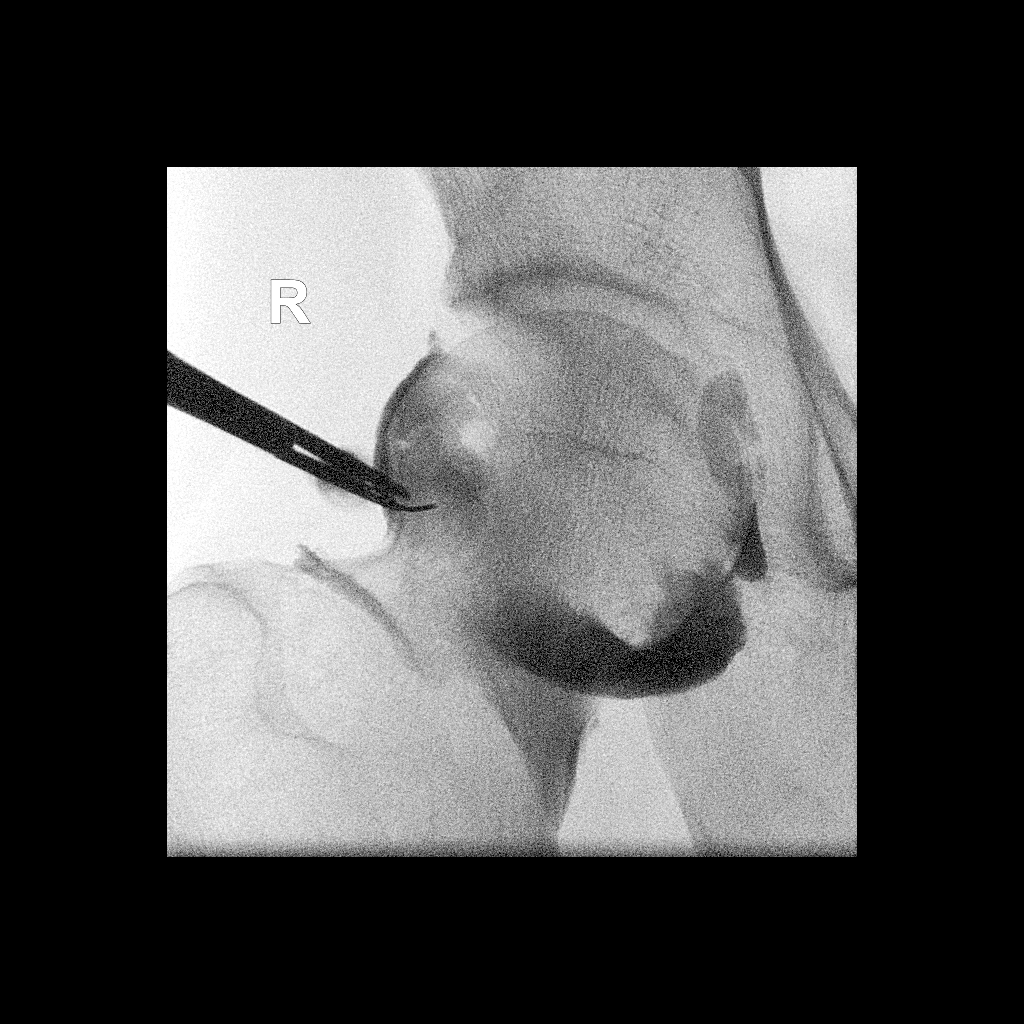

[Series 3: ortho adipose · 1 of 1 slices shown (3 of 3)]
[im 1/1]
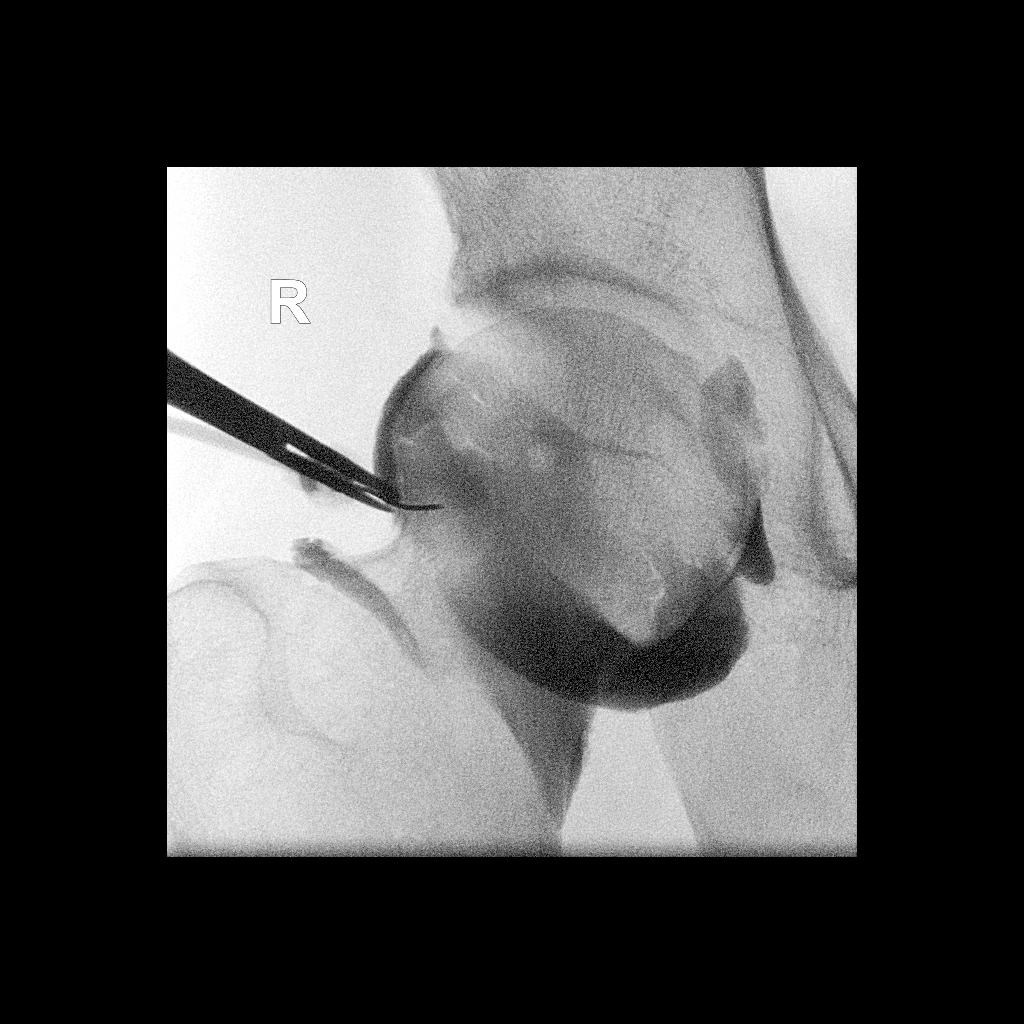

[3 of 3 positions shown; findings below may reference images not displayed]

FLUOROSCOPY TIME:  0 minutes 31 seconds. 16.48 micro gray meter
squared

PROCEDURE:
Right hip injection for MRI

Skin overlying the right hip was scrubbed with Betadine and draped
in sterile fashion. Local anesthesia was carried [DATE]%
lidocaine. A 22 gauge spinal needle was directed into the hip joint
on 1 pass. 18 cc of dilute Isovue 200 mixed with 0.1 cc MultiHance
was used to fill the joint. The procedure was well-tolerated and the
patient was taken to MRI in good condition.
IMPRESSION: Technically successful right hip injection for MRI.

## 2023-03-18 IMAGING — MR MR HIP*R* W/CM
4 of 6 series · 17 of 40 positions shown · IV contrast (agent unspecified)
Comparison: Right hip x-rays dated June 11, 2021. MRI right hip
arthrogram dated May 18, 2017.

CLINICAL DATA: Right hip injury 6 weeks ago. History of prior
labral repair in 2164.

EXAM:
MRI OF THE RIGHT HIP WITH CONTRAST (MR ARTHROGRAM)
TECHNIQUE: Multiplanar, multisequence MR imaging was performed following the
administration of intra-articular contrast.
CONTRAST:  See injection documentation.

[Series 3: T1 · coronal · 4.0mm · 0.53mm/px · 6 of 24 slices shown]
[im 1/24]
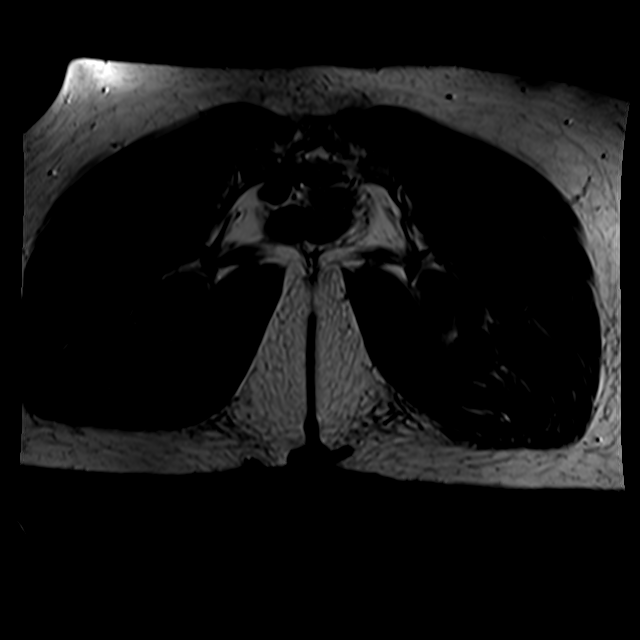
[im 5/24]
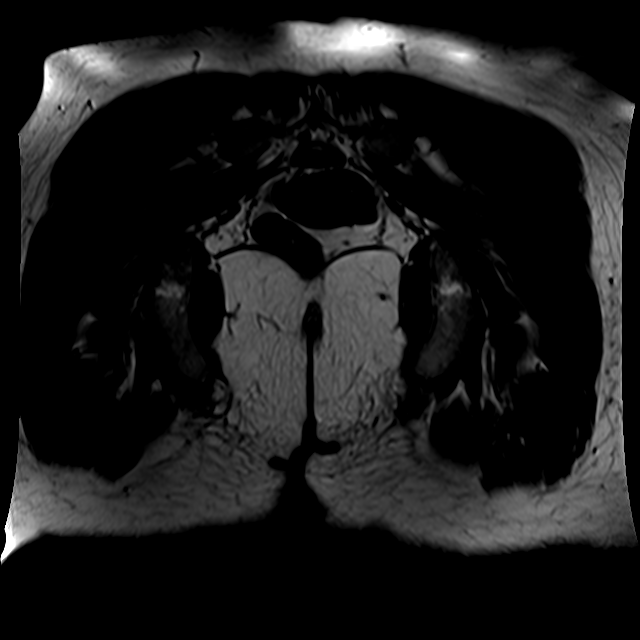
[im 10/24]
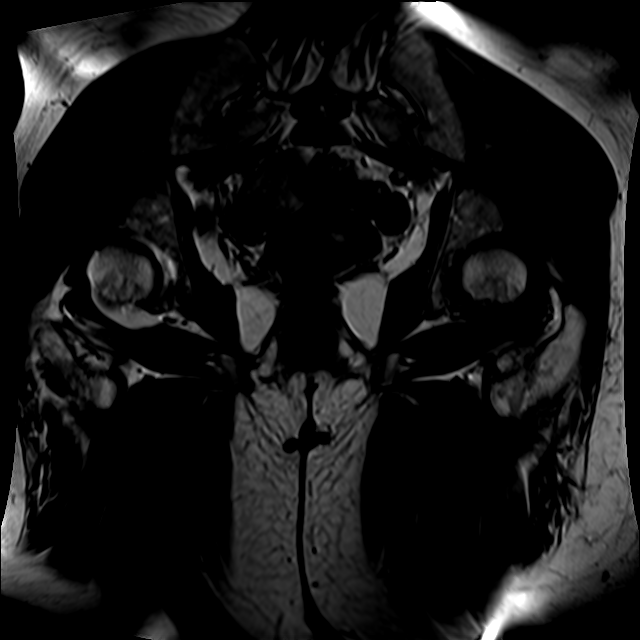
[im 14/24]
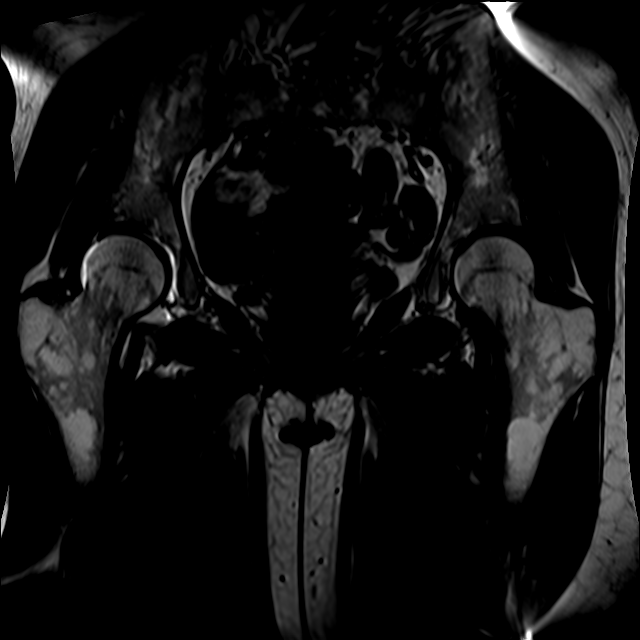
[im 19/24]
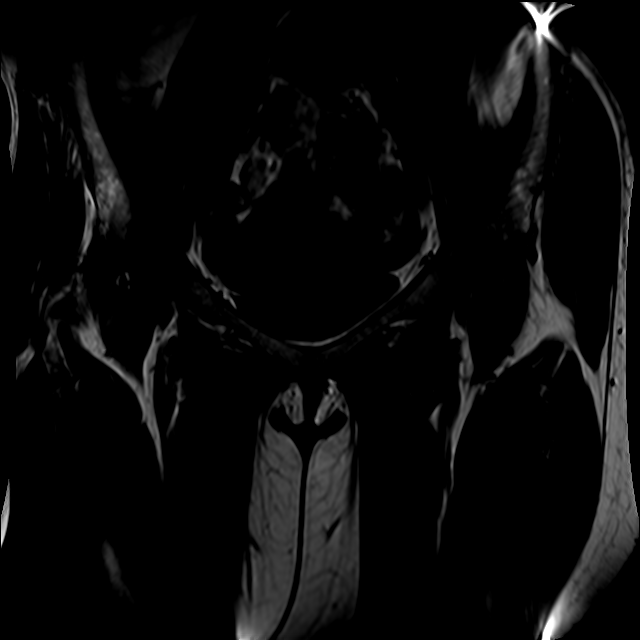
[im 24/24]
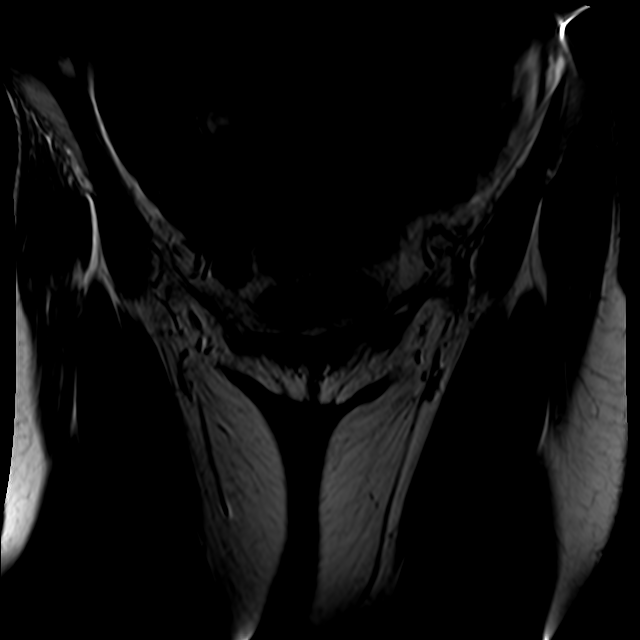

[Series 4: T2 fat-sat · coronal · 4.0mm · 0.53mm/px · 5 of 24 slices shown (1 of 2)]
[im 1/24]
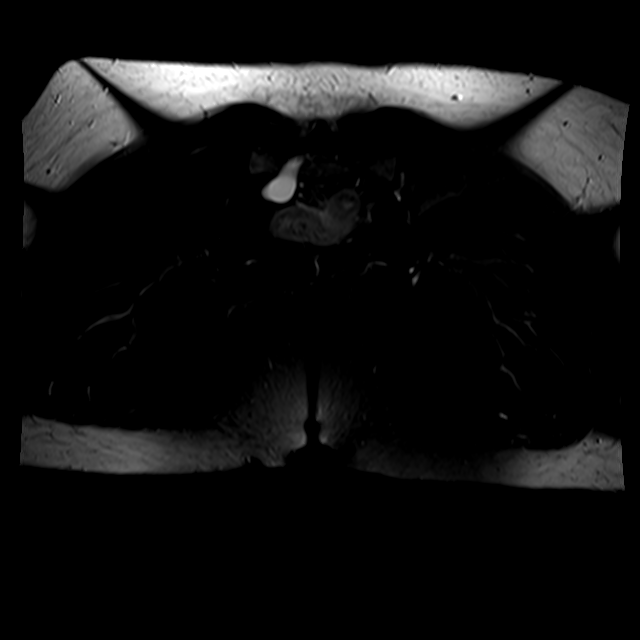
[im 4/24]
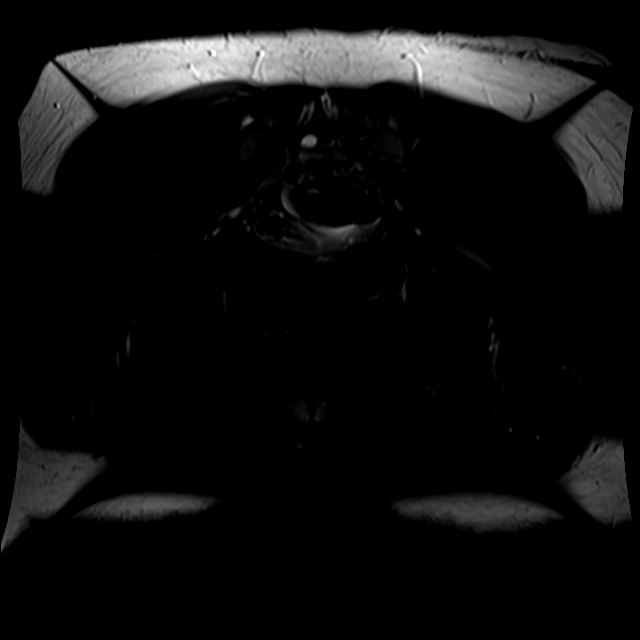
[im 8/24]
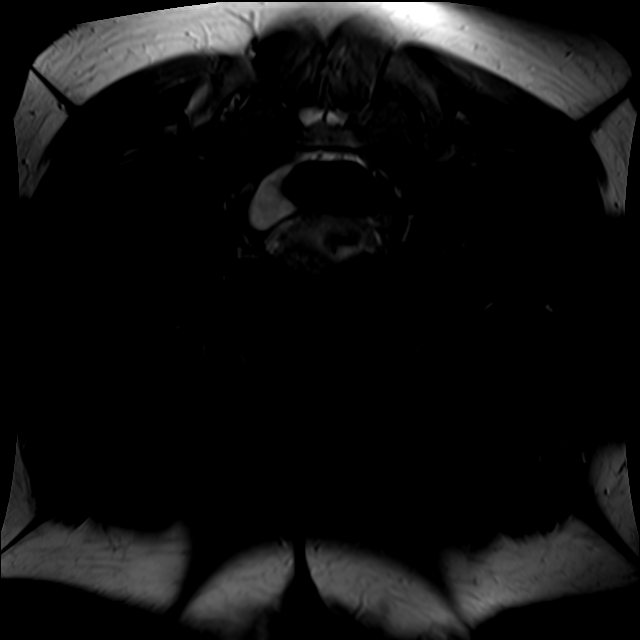
[im 12/24]
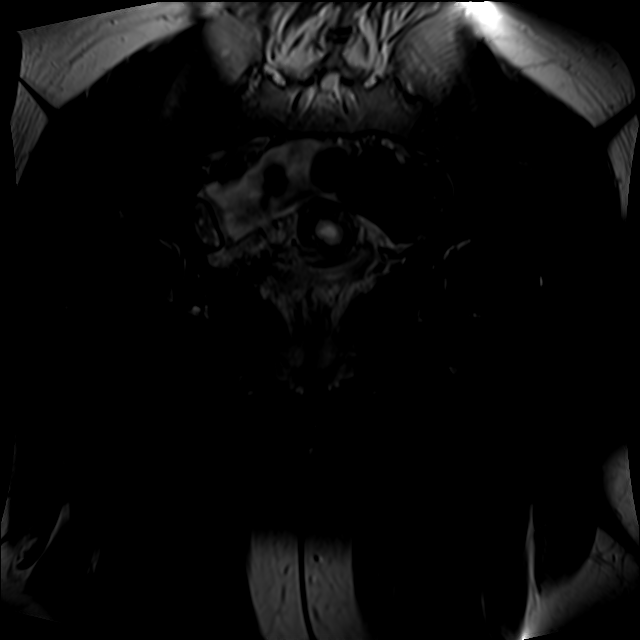
[im 20/24]
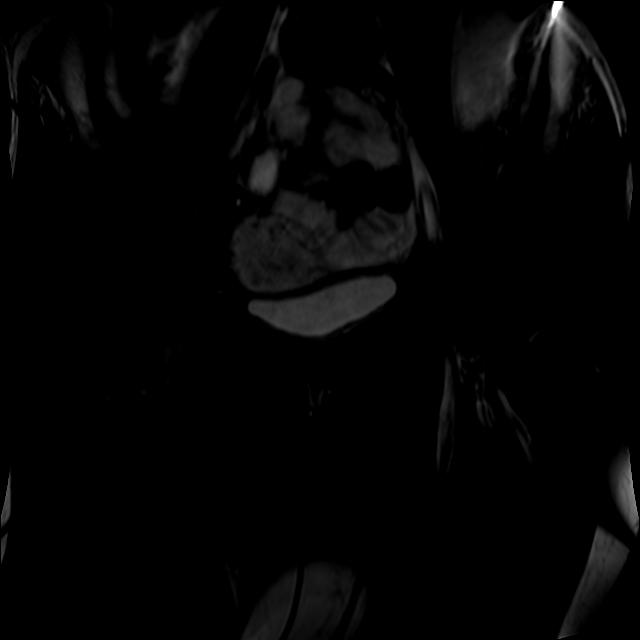

[Series 5: T2 fat-sat · axial · 4.0mm · 0.70mm/px · z∈[-77,+23]mm · 3 of 29 slices shown (2 of 2)]
[im 5/29]
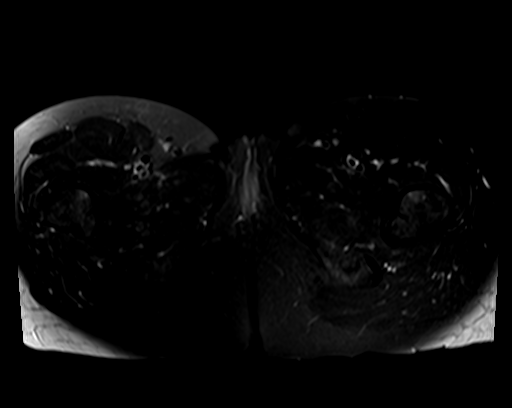
[im 17/29]
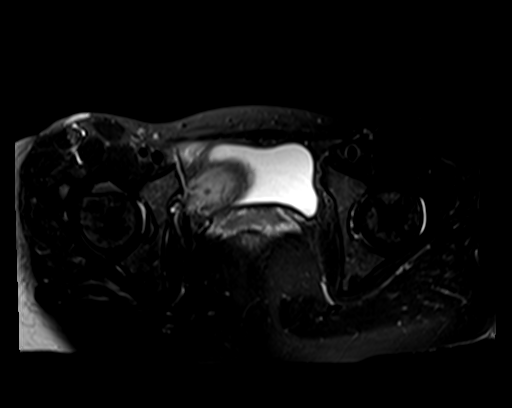
[im 25/29]
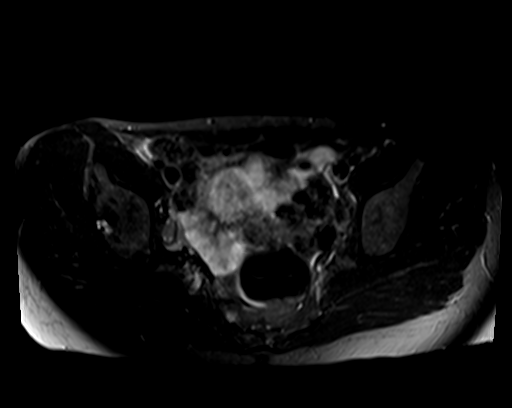

[Series 7: T1 fat-sat · axial · 4.0mm · 0.70mm/px · z∈[-102,-39]mm · 3 of 22 slices shown]
[im 5/22]
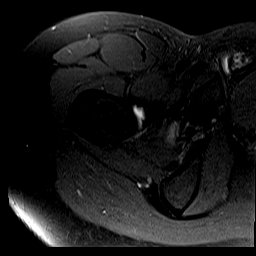
[im 13/22]
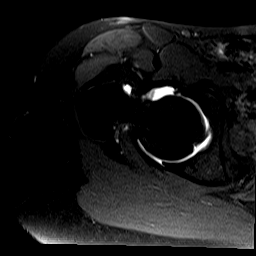
[im 22/22]
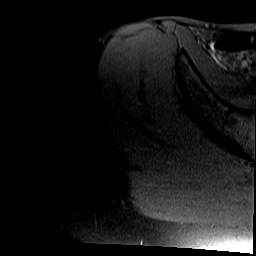

[17 of 40 positions shown; findings below may reference images not displayed]

FINDINGS: Bones: There is no evidence of acute fracture, dislocation or
avascular necrosis. No focal bone lesion. The visualized sacroiliac
joints and symphysis pubis appear normal. Sacral Tarlov cysts again
noted.

Articular cartilage and labrum

Articular cartilage: No focal chondral defect. Subchondral cystic
change in the right lateral acetabular roof.

Labrum: New right anterior labral tear (series 7, images 10 and 11).
Interval right anterior superior labral repair.

Joint or bursal effusion

Joint effusion: The right hip joint is distended with
intra-articular contrast. No significant left hip joint effusion.

Bursae: No focal periarticular fluid collection.

Muscles and tendons

Muscles and tendons: The visualized gluteus, hamstring and iliopsoas
tendons appear normal. No muscle edema or atrophy.

Other findings

Miscellaneous: The visualized internal pelvic contents appear
unremarkable.
IMPRESSION: 1. New right anterior labral tear.

## 2023-03-23 DIAGNOSIS — M9903 Segmental and somatic dysfunction of lumbar region: Secondary | ICD-10-CM | POA: Diagnosis not present

## 2023-03-23 DIAGNOSIS — M9905 Segmental and somatic dysfunction of pelvic region: Secondary | ICD-10-CM | POA: Diagnosis not present

## 2023-03-23 DIAGNOSIS — M9902 Segmental and somatic dysfunction of thoracic region: Secondary | ICD-10-CM | POA: Diagnosis not present

## 2023-03-23 DIAGNOSIS — M9901 Segmental and somatic dysfunction of cervical region: Secondary | ICD-10-CM | POA: Diagnosis not present

## 2023-03-30 DIAGNOSIS — M9903 Segmental and somatic dysfunction of lumbar region: Secondary | ICD-10-CM | POA: Diagnosis not present

## 2023-03-30 DIAGNOSIS — M9905 Segmental and somatic dysfunction of pelvic region: Secondary | ICD-10-CM | POA: Diagnosis not present

## 2023-03-30 DIAGNOSIS — M9902 Segmental and somatic dysfunction of thoracic region: Secondary | ICD-10-CM | POA: Diagnosis not present

## 2023-03-30 DIAGNOSIS — M9901 Segmental and somatic dysfunction of cervical region: Secondary | ICD-10-CM | POA: Diagnosis not present

## 2023-04-01 ENCOUNTER — Encounter: Payer: Self-pay | Admitting: Family Medicine

## 2023-04-01 ENCOUNTER — Ambulatory Visit: Payer: 59 | Admitting: Family Medicine

## 2023-04-01 VITALS — BP 93/60 | HR 79 | Temp 98.3°F | Resp 18 | Ht 67.0 in | Wt 142.8 lb

## 2023-04-01 DIAGNOSIS — R1031 Right lower quadrant pain: Secondary | ICD-10-CM | POA: Diagnosis not present

## 2023-04-01 DIAGNOSIS — Z7689 Persons encountering health services in other specified circumstances: Secondary | ICD-10-CM

## 2023-04-01 LAB — CBC WITH DIFFERENTIAL/PLATELET
Basophils Absolute: 0 10*3/uL (ref 0.0–0.2)
Basos: 0 %
EOS (ABSOLUTE): 0.3 10*3/uL (ref 0.0–0.4)
Eos: 5 %
Hematocrit: 43.8 % (ref 34.0–46.6)
Hemoglobin: 14.4 g/dL (ref 11.1–15.9)
Lymphocytes Absolute: 1.7 10*3/uL (ref 0.7–3.1)
Lymphs: 33 %
MCH: 33.5 pg — ABNORMAL HIGH (ref 26.6–33.0)
MCHC: 32.9 g/dL (ref 31.5–35.7)
MCV: 102 fL — ABNORMAL HIGH (ref 79–97)
Monocytes Absolute: 0.3 10*3/uL (ref 0.1–0.9)
Monocytes: 6 %
Neutrophils Absolute: 2.9 10*3/uL (ref 1.4–7.0)
Neutrophils: 56 %
Platelets: 275 10*3/uL (ref 150–450)
RBC: 4.3 x10E6/uL (ref 3.77–5.28)
RDW: 12.2 % (ref 11.7–15.4)
WBC: 5.1 10*3/uL (ref 3.4–10.8)

## 2023-04-01 LAB — COMPREHENSIVE METABOLIC PANEL
ALT: 16 IU/L (ref 0–32)
AST: 15 IU/L (ref 0–40)
Albumin: 4.2 g/dL (ref 3.9–4.9)
Alkaline Phosphatase: 77 IU/L (ref 44–121)
BUN/Creatinine Ratio: 12 (ref 9–23)
BUN: 13 mg/dL (ref 6–20)
Bilirubin Total: 0.4 mg/dL (ref 0.0–1.2)
CO2: 22 mmol/L (ref 20–29)
Calcium: 9.1 mg/dL (ref 8.7–10.2)
Chloride: 106 mmol/L (ref 96–106)
Creatinine, Ser: 1.13 mg/dL — ABNORMAL HIGH (ref 0.57–1.00)
Globulin, Total: 2.3 g/dL (ref 1.5–4.5)
Glucose: 77 mg/dL (ref 70–99)
Potassium: 4.6 mmol/L (ref 3.5–5.2)
Sodium: 139 mmol/L (ref 134–144)
Total Protein: 6.5 g/dL (ref 6.0–8.5)
eGFR: 64 mL/min/{1.73_m2} (ref >59)

## 2023-04-01 NOTE — Progress Notes (Addendum)
New Patient Office Visit  Subjective    Patient ID: Tammy Cunningham, female    DOB: [birth year]  Age: 37 y.o. MRN: [id number]  CC:  Chief Complaint  Patient presents with   Establish Care    Patient is here to establish care with new PCP, She states that since Monday she has been having a lot of stomach pain with cramping, diarrhea, and some blood in stool    HPI Tammy Cunningham presents to establish care with this practice. She is new to me.  Reports abdominal pain and cramping, diarrhea, blood in stool since Monday.   Has been feeling really sharp pain in abdomen, generalized and worse on left. Pain, cramping, diarrhea Monday-Thursday  x 4-5 times  saw blood on Tuesday on toilet paper, looked like it was from the stool not hemorrhoids. Feels like stomach is "very full" no nausea or vomiting. Pain in located in LLQ and RLQ consistently. She does not feel well. Denies fever. Unable to eat normally, keeping fluids down.   Has never had this before.  One month ago, flew to [place], no camping.  Magnesium supplement 3 weeks ago. Plays roller derby, no blunt trauma, no bruising.     Outpatient Encounter Medications as of 04/01/2023  Medication Sig   albuterol (VENTOLIN HFA) 108 (90 Base) MCG/ACT inhaler Inhale 1-2 puffs into the lungs every 6 (six) hours as needed for wheezing or shortness of breath.   [START ON 04/17/2023] amphetamine-dextroamphetamine (ADDERALL XR) 15 MG 24 hr capsule Take 1 capsule by mouth every morning.   amphetamine-dextroamphetamine (ADDERALL XR) 15 MG 24 hr capsule Take 1 capsule by mouth every morning.   amphetamine-dextroamphetamine (ADDERALL XR) 15 MG 24 hr capsule Take 1 capsule by mouth every morning.   amphetamine-dextroamphetamine (ADDERALL) 15 MG tablet Take 1 tablet by mouth daily after lunch.   [START ON 04/27/2023] amphetamine-dextroamphetamine (ADDERALL) 15 MG tablet Take 1 tablet by mouth daily before breakfast.   beclomethasone (QVAR REDIHALER) 40 MCG/ACT inhaler  Inhale 2 puffs into the lungs 2 (two) times daily.   EPINEPHrine 0.3 mg/0.3 mL IJ SOAJ injection Inject into the muscle.   escitalopram (LEXAPRO) 20 MG tablet Take 1 tablet (20 mg total) by mouth daily.   fexofenadine (ALLEGRA) 180 MG tablet Take by mouth.   JUNEL 1/20 1-20 MG-MCG tablet Take 1 tablet by mouth daily.   Rimegepant Sulfate (NURTEC) 75 MG TBDP Take 1 tablet (75 mg total) by mouth daily.   tiZANidine (ZANAFLEX) 2 MG tablet TAKE 1 TABLET BY MOUTH EVERYDAY AT BEDTIME   topiramate (TOPAMAX) 50 MG tablet TAKE 1 TABLET BY MOUTH TWICE A DAY   triamcinolone (NASACORT) 55 MCG/ACT AERO nasal inhaler Place 1 spray into the nose daily. Start with 1 spray each side twice a day for 3 days, then reduce to daily.   UBRELVY 50 MG TABS Take by mouth.   amphetamine-dextroamphetamine (ADDERALL) 15 MG tablet Take 1 tablet by mouth daily after lunch.   NURTEC 75 MG TBDP TAKE 1 TABLET BY MOUTH EVERY DAY (Patient not taking: Reported on 04/01/2023)   No facility-administered encounter medications on file as of 04/01/2023.    Past Medical History:  Diagnosis Date   Allergy    Anxiety    Asthma    Depression    GAD (generalized anxiety disorder) 05/08/2015   High risk medication use 01/21/2015   Lipid screening 09/05/2020   Major depression in complete remission (HCC) 05/08/2015   Neck pain 05/27/2021   Seizures (HCC)  Unexplained weight loss 02/10/2013    Past Surgical History:  Procedure Laterality Date   HIP SURGERY Right    2019   OVARIAN CYST REMOVAL     2005    Family History  Problem Relation Age of Onset   Ulcers Mother    Healthy Father    Ulcerative colitis Sister     Social History   Socioeconomic History   Marital status: Married    Spouse name: Not on file   Number of children: Not on file   Years of education: Not on file   Highest education level: Doctorate  Occupational History   Not on file  Tobacco Use   Smoking status: Never    Passive exposure: Current  (occassionally)   Smokeless tobacco: Never  Vaping Use   Vaping status: Never Used  Substance and Sexual Activity   Alcohol use: Yes    Alcohol/week: 1.0 standard drink of alcohol    Types: 1 Cans of beer per week    Comment: rarely   Drug use: Never   Sexual activity: Yes    Birth control/protection: Pill  Other Topics Concern   Not on file  Social History Narrative   Not on file   Social Determinants of Health   Financial Resource Strain: Medium Risk (11/30/2022)   Overall Financial Resource Strain (CARDIA)    Difficulty of Paying Living Expenses: Somewhat hard  Food Insecurity: Patient Declined (11/30/2022)   Hunger Vital Sign    Worried About Running Out of Food in the Last Year: Patient declined    Ran Out of Food in the Last Year: Patient declined  Transportation Needs: No Transportation Needs (11/30/2022)   PRAPARE - Administrator, Civil Service (Medical): No    Lack of Transportation (Non-Medical): No  Physical Activity: Sufficiently Active (11/30/2022)   Exercise Vital Sign    Days of Exercise per Week: 3 days    Minutes of Exercise per Session: 90 min  Stress: No Stress Concern Present (11/30/2022)   [employer] of Occupational Health - Occupational Stress Questionnaire    Feeling of Stress : Only a little  Social Connections: Unknown (11/30/2022)   Social Connection and Isolation Panel [NHANES]    Frequency of Communication with Friends and Family: Patient declined    Frequency of Social Gatherings with Friends and Family: Patient declined    Attends Religious Services: Patient declined    Database administrator or Organizations: Yes    Attends Engineer, structural: More than 4 times per year    Marital Status: Married  Catering manager Violence: Unknown (12/04/2021)   Received from [employer], [facility]   HITS    Physically Hurt: Not on file    Insult or Talk Down To: Not on file    Threaten Physical Harm: Not on file    Scream or  Curse: Not on file    Review of Systems  Constitutional:  Negative for chills and fever.  Gastrointestinal:  Positive for abdominal pain, blood in stool and diarrhea. Negative for nausea and vomiting.  Genitourinary:  Negative for dysuria, frequency and urgency.  Neurological:  Positive for headaches (history of migraines. not worse than normal.). Negative for dizziness.        Objective    BP 93/60   Pulse 79   Temp 98.3 F (36.8 C) (Oral)   Resp 18   Ht 5\' 7"  (1.702 m)   Wt 142 lb 12.8 oz (64.8 kg)  SpO2 100%   BMI 22.37 kg/m   Physical Exam Vitals and nursing note reviewed.  Constitutional:      General: She is not in acute distress.    Appearance: Normal appearance. She is not ill-appearing.  Cardiovascular:     Rate and Rhythm: Regular rhythm.     Heart sounds: Normal heart sounds.  Pulmonary:     Effort: Pulmonary effort is normal.     Breath sounds: Normal breath sounds.  Abdominal:     General: Bowel sounds are normal.     Palpations: Abdomen is soft.     Tenderness: There is abdominal tenderness in the right lower quadrant and left lower quadrant.  Neurological:     Mental Status: She is alert.        Assessment & Plan:   Establishing care with new doctor, encounter for  Right lower quadrant abdominal pain -     Comprehensive metabolic panel -     CBC with Differential/Platelet -     CT ABDOMEN PELVIS W CONTRAST; Future    Discussed sending to ED now vs. Getting stat work-up at primary care office. Prefers to get work-up as outpatient if possible. If this is not approved, I will recommend going to ED today. Understands if symptoms change or worsen, she will go to ED immediately. Symptoms have not worsened since Monday, it is reasonable to get outpatient work-up. Next steps to be determined based on results today.  CBC, CMP stat Stat Abdomen/pelvis ordered.  Agrees with plan of care discussed.  Questions answered.   Return for needs follow  once this has resolved. Novella Olive, FNP

## 2023-04-02 ENCOUNTER — Ambulatory Visit (INDEPENDENT_AMBULATORY_CARE_PROVIDER_SITE_OTHER): Payer: 59

## 2023-04-02 DIAGNOSIS — K921 Melena: Secondary | ICD-10-CM | POA: Diagnosis not present

## 2023-04-02 DIAGNOSIS — R1031 Right lower quadrant pain: Secondary | ICD-10-CM

## 2023-04-02 DIAGNOSIS — K6389 Other specified diseases of intestine: Secondary | ICD-10-CM | POA: Diagnosis not present

## 2023-04-02 DIAGNOSIS — R140 Abdominal distension (gaseous): Secondary | ICD-10-CM | POA: Diagnosis not present

## 2023-04-02 MED ORDER — IOHEXOL 300 MG/ML  SOLN
100.0000 mL | Freq: Once | INTRAMUSCULAR | Status: AC | PRN
  Administered 2023-04-02: 100 mL via INTRAVENOUS

## 2023-04-09 DIAGNOSIS — M9905 Segmental and somatic dysfunction of pelvic region: Secondary | ICD-10-CM | POA: Diagnosis not present

## 2023-04-09 DIAGNOSIS — M9902 Segmental and somatic dysfunction of thoracic region: Secondary | ICD-10-CM | POA: Diagnosis not present

## 2023-04-09 DIAGNOSIS — M9901 Segmental and somatic dysfunction of cervical region: Secondary | ICD-10-CM | POA: Diagnosis not present

## 2023-04-09 DIAGNOSIS — M9903 Segmental and somatic dysfunction of lumbar region: Secondary | ICD-10-CM | POA: Diagnosis not present

## 2023-04-10 ENCOUNTER — Other Ambulatory Visit: Payer: Self-pay | Admitting: Family

## 2023-04-10 DIAGNOSIS — G43701 Chronic migraine without aura, not intractable, with status migrainosus: Secondary | ICD-10-CM

## 2023-04-12 ENCOUNTER — Other Ambulatory Visit (HOSPITAL_COMMUNITY): Payer: Self-pay

## 2023-04-13 DIAGNOSIS — M9901 Segmental and somatic dysfunction of cervical region: Secondary | ICD-10-CM | POA: Diagnosis not present

## 2023-04-13 DIAGNOSIS — M9902 Segmental and somatic dysfunction of thoracic region: Secondary | ICD-10-CM | POA: Diagnosis not present

## 2023-04-13 DIAGNOSIS — M9905 Segmental and somatic dysfunction of pelvic region: Secondary | ICD-10-CM | POA: Diagnosis not present

## 2023-04-13 DIAGNOSIS — M9903 Segmental and somatic dysfunction of lumbar region: Secondary | ICD-10-CM | POA: Diagnosis not present

## 2023-05-11 ENCOUNTER — Ambulatory Visit (INDEPENDENT_AMBULATORY_CARE_PROVIDER_SITE_OTHER): Payer: 59 | Admitting: Family Medicine

## 2023-05-11 ENCOUNTER — Encounter: Payer: Self-pay | Admitting: Family Medicine

## 2023-05-11 VITALS — BP 95/58 | HR 80 | Temp 98.9°F | Resp 18 | Ht 67.0 in | Wt 153.5 lb

## 2023-05-11 DIAGNOSIS — F909 Attention-deficit hyperactivity disorder, unspecified type: Secondary | ICD-10-CM

## 2023-05-11 DIAGNOSIS — M9902 Segmental and somatic dysfunction of thoracic region: Secondary | ICD-10-CM | POA: Diagnosis not present

## 2023-05-11 DIAGNOSIS — Z79899 Other long term (current) drug therapy: Secondary | ICD-10-CM

## 2023-05-11 DIAGNOSIS — I959 Hypotension, unspecified: Secondary | ICD-10-CM | POA: Diagnosis not present

## 2023-05-11 DIAGNOSIS — G43701 Chronic migraine without aura, not intractable, with status migrainosus: Secondary | ICD-10-CM | POA: Diagnosis not present

## 2023-05-11 DIAGNOSIS — M9903 Segmental and somatic dysfunction of lumbar region: Secondary | ICD-10-CM | POA: Diagnosis not present

## 2023-05-11 DIAGNOSIS — G43909 Migraine, unspecified, not intractable, without status migrainosus: Secondary | ICD-10-CM | POA: Diagnosis not present

## 2023-05-11 DIAGNOSIS — R110 Nausea: Secondary | ICD-10-CM | POA: Diagnosis not present

## 2023-05-11 DIAGNOSIS — M9905 Segmental and somatic dysfunction of pelvic region: Secondary | ICD-10-CM | POA: Diagnosis not present

## 2023-05-11 DIAGNOSIS — M9901 Segmental and somatic dysfunction of cervical region: Secondary | ICD-10-CM | POA: Diagnosis not present

## 2023-05-11 DIAGNOSIS — R001 Bradycardia, unspecified: Secondary | ICD-10-CM | POA: Diagnosis not present

## 2023-05-11 MED ORDER — UBRELVY 50 MG PO TABS
50.0000 mg | ORAL_TABLET | ORAL | 1 refills | Status: AC | PRN
Start: 2023-05-11 — End: ?

## 2023-05-11 MED ORDER — AMPHETAMINE-DEXTROAMPHET ER 15 MG PO CP24
15.0000 mg | ORAL_CAPSULE | Freq: Every morning | ORAL | 0 refills | Status: AC
Start: 2023-05-11 — End: ?

## 2023-05-11 MED ORDER — ONDANSETRON 8 MG PO TBDP
8.0000 mg | ORAL_TABLET | Freq: Three times a day (TID) | ORAL | 0 refills | Status: AC | PRN
Start: 2023-05-11 — End: ?

## 2023-05-11 MED ORDER — AMPHETAMINE-DEXTROAMPHET ER 15 MG PO CP24
15.0000 mg | ORAL_CAPSULE | ORAL | 0 refills | Status: DC
Start: 2023-06-10 — End: 2023-06-18

## 2023-05-11 MED ORDER — TOPIRAMATE 50 MG PO TABS
50.0000 mg | ORAL_TABLET | Freq: Two times a day (BID) | ORAL | 2 refills | Status: AC
Start: 2023-05-11 — End: ?

## 2023-05-11 MED ORDER — AMPHETAMINE-DEXTROAMPHET ER 15 MG PO CP24
15.0000 mg | ORAL_CAPSULE | ORAL | 0 refills | Status: AC
Start: 2023-07-10 — End: 2024-06-23

## 2023-05-11 MED ORDER — AMPHETAMINE-DEXTROAMPHETAMINE 15 MG PO TABS
15.0000 mg | ORAL_TABLET | Freq: Every day | ORAL | 0 refills | Status: AC
Start: 2023-07-10 — End: 2024-06-23

## 2023-05-11 NOTE — Assessment & Plan Note (Signed)
Taking Adderall XR 15 mg daily with as needed Adderall 15 mg immediate release as needed. Symptoms are well controlled on current regimen. Controlled substance agreement and urine tox ordered. Refills sent. Follow-up in 3 months for medication management.

## 2023-05-11 NOTE — Progress Notes (Addendum)
Established Patient Office Visit  Subjective   Patient ID: Tammy Cunningham, female    DOB: 1985-11-22  Age: 37 y.o. MRN: [id number]  Chief Complaint  Patient presents with   Medication Refill    Patient is here for a medication refill    HPI Presents today for medication management for   ADHD:  controlled substance agreement signed today. Urine tox screen completed.  Taking Adderall XR 15 mg and as needed Adderall 15 mg immediate relief as needed.  Symptoms  are controlled with current dosages. Sleep not disturbed.   Migraine: Taking ubrelvy 50 mg at onset of migraine, will repeat most headaches. Topiramate 50 mg daily. Reports 2-4 migraines per month. Controlling symptoms well. Has headache currently. Went to UC last night for migraine due to being out of medication. Having nausea. Will refill Zofran 8 mg SL today as well.    Review of Systems  Respiratory:  Negative for shortness of breath.   Cardiovascular:  Negative for chest pain.  Gastrointestinal:  Negative for nausea and vomiting.  Neurological:  Positive for dizziness (resolves quickly) and headaches (2-4 per month).  Psychiatric/Behavioral:  Negative for depression and suicidal ideas. The patient does not have insomnia.       Objective:     BP (!) 95/58   Pulse 80   Temp 98.9 F (37.2 C) (Oral)   Resp 18   Ht 5\' 7"  (1.702 m)   Wt 153 lb 8 oz (69.6 kg)   SpO2 100%   BMI 24.04 kg/m  BP Readings from Last 3 Encounters:  05/11/23 (!) 95/58  04/01/23 93/60  02/15/23 111/77      Physical Exam Vitals and nursing note reviewed.  Constitutional:      General: She is not in acute distress.    Appearance: Normal appearance.  Cardiovascular:     Rate and Rhythm: Regular rhythm.     Heart sounds: Normal heart sounds.  Pulmonary:     Effort: Pulmonary effort is normal.     Breath sounds: Normal breath sounds.  Skin:    General: Skin is warm and dry.  Neurological:     General: No focal deficit present.      Mental Status: She is alert. Mental status is at baseline.  Psychiatric:        Mood and Affect: Mood normal.        Behavior: Behavior normal.        Thought Content: Thought content normal.        Judgment: Judgment normal.     No results found for any visits on 05/11/23.    The ASCVD Risk score (Arnett DK, et al., 2019) failed to calculate for the following reasons:   The 2019 ASCVD risk score is only valid for ages 54 to 25    Assessment & Plan:   Problem List Items Addressed This Visit     Controlled substance agreement signed   Relevant Medications   amphetamine-dextroamphetamine (ADDERALL XR) 15 MG 24 hr capsule (Start on 06/10/2023)   amphetamine-dextroamphetamine (ADDERALL) 15 MG tablet (Start on 07/10/2023)   amphetamine-dextroamphetamine (ADDERALL XR) 15 MG 24 hr capsule   amphetamine-dextroamphetamine (ADDERALL XR) 15 MG 24 hr capsule (Start on 07/10/2023)   Other Relevant Orders   ToxASSURE Select 13 (MW), Urine   Migraine with status migrainosus, not intractable    Taking Ubrelvy 50 mg at start of migraine, may repeat in 2 hours as needed. Topiramate 50 mg daily  Reports 2-4 migraines per  month. Symptoms are usually controlled with current regimen. Went to urgent care 9/9 due to not having medication refills. Refills sent today.       Relevant Medications   UBRELVY 50 MG TABS   topiramate (TOPAMAX) 50 MG tablet   Attention deficit hyperactivity disorder (ADHD) - Primary    Taking Adderall XR 15 mg daily with as needed Adderall 15 mg immediate release as needed. Symptoms are well controlled on current regimen. Controlled substance agreement and urine tox ordered. Refills sent. Follow-up in 3 months for medication management.       Relevant Medications   amphetamine-dextroamphetamine (ADDERALL XR) 15 MG 24 hr capsule (Start on 06/10/2023)   amphetamine-dextroamphetamine (ADDERALL) 15 MG tablet (Start on 07/10/2023)   amphetamine-dextroamphetamine (ADDERALL XR) 15  MG 24 hr capsule   amphetamine-dextroamphetamine (ADDERALL XR) 15 MG 24 hr capsule (Start on 07/10/2023)   Other Relevant Orders   ToxASSURE Select 13 (MW), Urine   Nausea    Nausea present due to recent migraine. Zofran 8 mg SL given to use as needed.       Relevant Medications   ondansetron (ZOFRAN-ODT) 8 MG disintegrating tablet  Agrees with plan of care discussed.  Questions answered. Will schedule CPE with pap via My Chart.   Return in about 3 months (around 08/10/2023) for ADHD and migraine medication management.    Novella Olive, FNP

## 2023-05-11 NOTE — Assessment & Plan Note (Signed)
Taking Ubrelvy 50 mg at start of migraine, may repeat in 2 hours as needed. Topiramate 50 mg daily  Reports 2-4 migraines per month. Symptoms are usually controlled with current regimen. Went to urgent care 9/9 due to not having medication refills. Refills sent today.

## 2023-05-11 NOTE — Assessment & Plan Note (Signed)
Nausea present due to recent migraine. Zofran 8 mg SL given to use as needed.

## 2023-05-18 LAB — TOXASSURE SELECT 13 (MW), URINE

## 2023-06-15 DIAGNOSIS — M9903 Segmental and somatic dysfunction of lumbar region: Secondary | ICD-10-CM | POA: Diagnosis not present

## 2023-06-15 DIAGNOSIS — M9901 Segmental and somatic dysfunction of cervical region: Secondary | ICD-10-CM | POA: Diagnosis not present

## 2023-06-15 DIAGNOSIS — M9902 Segmental and somatic dysfunction of thoracic region: Secondary | ICD-10-CM | POA: Diagnosis not present

## 2023-06-15 DIAGNOSIS — M9905 Segmental and somatic dysfunction of pelvic region: Secondary | ICD-10-CM | POA: Diagnosis not present

## 2023-06-18 ENCOUNTER — Encounter: Payer: Self-pay | Admitting: Family Medicine

## 2023-06-18 ENCOUNTER — Other Ambulatory Visit: Payer: Self-pay | Admitting: Family

## 2023-06-18 DIAGNOSIS — F419 Anxiety disorder, unspecified: Secondary | ICD-10-CM

## 2023-06-18 DIAGNOSIS — F909 Attention-deficit hyperactivity disorder, unspecified type: Secondary | ICD-10-CM

## 2023-06-18 DIAGNOSIS — Z79899 Other long term (current) drug therapy: Secondary | ICD-10-CM

## 2023-06-18 DIAGNOSIS — F988 Other specified behavioral and emotional disorders with onset usually occurring in childhood and adolescence: Secondary | ICD-10-CM

## 2023-06-18 MED ORDER — AMPHETAMINE-DEXTROAMPHET ER 15 MG PO CP24: 15.0000 mg | ORAL_CAPSULE | ORAL | 0 refills | Status: AC

## 2023-07-05 ENCOUNTER — Ambulatory Visit: Payer: 59 | Admitting: Family Medicine

## 2023-07-05 ENCOUNTER — Encounter: Payer: Self-pay | Admitting: Family Medicine

## 2023-07-05 VITALS — BP 110/69 | HR 80 | Temp 98.1°F | Ht 67.0 in | Wt 149.0 lb

## 2023-07-05 DIAGNOSIS — F419 Anxiety disorder, unspecified: Secondary | ICD-10-CM

## 2023-07-05 DIAGNOSIS — F32A Depression, unspecified: Secondary | ICD-10-CM | POA: Diagnosis not present

## 2023-07-05 DIAGNOSIS — F988 Other specified behavioral and emotional disorders with onset usually occurring in childhood and adolescence: Secondary | ICD-10-CM | POA: Diagnosis not present

## 2023-07-05 MED ORDER — ESCITALOPRAM OXALATE 20 MG PO TABS: 20.0000 mg | ORAL_TABLET | Freq: Every day | ORAL | 1 refills | Status: AC

## 2023-07-05 NOTE — Progress Notes (Signed)
   Established Patient Office Visit  Subjective   Patient ID: Tammy Cunningham, female    DOB: [birth year]  Age: 37 y.o. MRN: [id number]  Chief Complaint  Patient presents with   Medical Management of Chronic Issues    Medication refills, paper copy of scripts     HPI  Last seen on 9/10 for Adderall XR 15 mg. Sent to [facility] on [address]. Reports today that provider did not prescribe the short acting Adderall correctly. Provider was not informed of this until today. Her regular pharmacy has closed, and now she is having issues with the Adderall being in stock, even though she has not tried to call to find a pharmacy that has it in stock.  Believes the solution is a paper prescription.  Patient states that since this provider is unable to give paper prescription for controlled substance, she will need to find another provider.    ROS    Objective:     BP 110/69   Pulse 80   Temp 98.1 F (36.7 C) (Oral)   Ht 5\' 7"  (1.702 m)   Wt 149 lb (67.6 kg)   SpO2 99%   BMI 23.34 kg/m    Physical Exam Vitals and nursing note reviewed.  Constitutional:      General: She is not in acute distress.    Appearance: Normal appearance.  Pulmonary:     Effort: Pulmonary effort is normal.  Skin:    General: Skin is warm and dry.  Neurological:     General: No focal deficit present.     Mental Status: She is alert. Mental status is at baseline.     No results found for any visits on 07/05/23.    The ASCVD Risk score (Arnett DK, et al., 2019) failed to calculate for the following reasons:   The 2019 ASCVD risk score is only valid for ages 26 to 54    Assessment & Plan:   Problem List Items Addressed This Visit     Attention deficit disorder (ADD) in adult - Primary    Having issues with finding Adderall in stock since her regular pharmacy on [address] closed. She has not tried calling local pharmacies to see who may have it in stock. Explained provider is happy to send to any  pharmacy electronically if she can verify that it is in stock.  Requesting paper prescription, explained this provider/practice does not provide control substances on paper prescriptions. Reports she will need to find another provider that is willing to do this.        Anxiety and depression    Takes escitalopram 20 mg daily doe anxiety and depression. Refill sent as courtesy while she looks for another provider.        Relevant Medications   escitalopram (LEXAPRO) 20 MG tablet  Visit ended with patient stating she will find another provider to fill Adderall.  Agrees with plan of care discussed.  Questions answered.   No follow-ups on file.    Novella Olive, FNP

## 2023-07-05 NOTE — Assessment & Plan Note (Addendum)
Having issues with finding Adderall in stock since her regular pharmacy on [address] closed. She has not tried calling local pharmacies to see who may have it in stock. Explained provider is happy to send to any pharmacy electronically if she can verify that it is in stock.  Requesting paper prescription, explained this provider/practice does not provide control substances on paper prescriptions. Reports she will need to find another provider that is willing to do this.

## 2023-07-05 NOTE — Assessment & Plan Note (Addendum)
Takes escitalopram 20 mg daily doe anxiety and depression. Refill sent as courtesy while she looks for another provider.

## 2023-07-06 DIAGNOSIS — M9903 Segmental and somatic dysfunction of lumbar region: Secondary | ICD-10-CM | POA: Diagnosis not present

## 2023-07-06 DIAGNOSIS — M9901 Segmental and somatic dysfunction of cervical region: Secondary | ICD-10-CM | POA: Diagnosis not present

## 2023-07-06 DIAGNOSIS — M9905 Segmental and somatic dysfunction of pelvic region: Secondary | ICD-10-CM | POA: Diagnosis not present

## 2023-07-06 DIAGNOSIS — M9902 Segmental and somatic dysfunction of thoracic region: Secondary | ICD-10-CM | POA: Diagnosis not present

## 2023-07-07 DIAGNOSIS — F3181 Bipolar II disorder: Secondary | ICD-10-CM | POA: Diagnosis not present

## 2023-07-27 DIAGNOSIS — F3181 Bipolar II disorder: Secondary | ICD-10-CM | POA: Diagnosis not present

## 2023-07-27 DIAGNOSIS — M9902 Segmental and somatic dysfunction of thoracic region: Secondary | ICD-10-CM | POA: Diagnosis not present

## 2023-07-27 DIAGNOSIS — M9901 Segmental and somatic dysfunction of cervical region: Secondary | ICD-10-CM | POA: Diagnosis not present

## 2023-07-27 DIAGNOSIS — M9903 Segmental and somatic dysfunction of lumbar region: Secondary | ICD-10-CM | POA: Diagnosis not present

## 2023-07-27 DIAGNOSIS — M9905 Segmental and somatic dysfunction of pelvic region: Secondary | ICD-10-CM | POA: Diagnosis not present

## 2023-08-17 DIAGNOSIS — M9902 Segmental and somatic dysfunction of thoracic region: Secondary | ICD-10-CM | POA: Diagnosis not present

## 2023-08-17 DIAGNOSIS — M9901 Segmental and somatic dysfunction of cervical region: Secondary | ICD-10-CM | POA: Diagnosis not present

## 2023-08-17 DIAGNOSIS — M9903 Segmental and somatic dysfunction of lumbar region: Secondary | ICD-10-CM | POA: Diagnosis not present

## 2023-08-17 DIAGNOSIS — M9905 Segmental and somatic dysfunction of pelvic region: Secondary | ICD-10-CM | POA: Diagnosis not present

## 2023-09-09 DIAGNOSIS — F3181 Bipolar II disorder: Secondary | ICD-10-CM | POA: Diagnosis not present

## 2023-09-26 ENCOUNTER — Other Ambulatory Visit: Payer: Self-pay | Admitting: Family Medicine

## 2023-09-26 DIAGNOSIS — F419 Anxiety disorder, unspecified: Secondary | ICD-10-CM

## 2023-10-13 DIAGNOSIS — F909 Attention-deficit hyperactivity disorder, unspecified type: Secondary | ICD-10-CM | POA: Diagnosis not present

## 2023-10-13 DIAGNOSIS — G43709 Chronic migraine without aura, not intractable, without status migrainosus: Secondary | ICD-10-CM | POA: Diagnosis not present

## 2023-10-13 DIAGNOSIS — F419 Anxiety disorder, unspecified: Secondary | ICD-10-CM | POA: Diagnosis not present

## 2023-10-13 DIAGNOSIS — F32A Depression, unspecified: Secondary | ICD-10-CM | POA: Diagnosis not present

## 2023-10-13 DIAGNOSIS — Z7689 Persons encountering health services in other specified circumstances: Secondary | ICD-10-CM | POA: Diagnosis not present

## 2023-12-07 ENCOUNTER — Ambulatory Visit: Admitting: Family Medicine

## 2023-12-07 ENCOUNTER — Encounter: Payer: Self-pay | Admitting: Family Medicine

## 2023-12-07 ENCOUNTER — Other Ambulatory Visit: Payer: Self-pay

## 2023-12-07 VITALS — BP 100/72 | HR 75 | Ht 67.0 in | Wt 155.0 lb

## 2023-12-07 DIAGNOSIS — S73192A Other sprain of left hip, initial encounter: Secondary | ICD-10-CM | POA: Diagnosis not present

## 2023-12-07 DIAGNOSIS — M25552 Pain in left hip: Secondary | ICD-10-CM

## 2023-12-07 NOTE — Assessment & Plan Note (Signed)
 Plan ultrasound defect was noted.  Patient given injection and tolerated the procedure well, discussed icing regimen and home exercises, discussed which activities to do and which ones to avoid.  Increase activity slowly otherwise.  Follow-up again in 6 to 8 weeks.  Patient would like to avoid surgical intervention if possible.  I do think it is potentially possible.

## 2023-12-07 NOTE — Progress Notes (Signed)
 Tammy Cunningham [address] [place] [address] Phone: [phone] Subjective:   Tammy Cunningham, am serving as a scribe for Dr. Antoine Cunningham.  I'm seeing this patient by the request  of:  Tammy Olive, FNP  CC: Left hip pain  [id number]  Tammy Cunningham is a 38 y.o. female coming in with complaint of left hip pain.  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  Was discussing the potential for surgical intervention but got better after patient had the upper repair on the contralateral side.  Patient states that she began to have increase in pain for past month after standing at an event for 2 days. Also had to drive 10 hours to get to event and back. Pain occurring in anterior hip as well as superior to the back of the hip. Also notes tingling in L foot.     Past Medical History:  Diagnosis Date   Allergy    Anxiety    Asthma    Depression    GAD (generalized anxiety disorder) 05/08/2015   High risk medication use 01/21/2015   Lipid screening 09/05/2020   Major depression in complete remission (HCC) 05/08/2015   Neck pain 05/27/2021   Seizures (HCC)    Unexplained weight loss 02/10/2013   Past Surgical History:  Procedure Laterality Date   HIP SURGERY Right    2019   OVARIAN CYST REMOVAL     2005   Social History   Socioeconomic History   Marital status: Married    Spouse name: Not on file   Number of children: Not on file   Years of education: Not on file   Highest education level: Doctorate  Occupational History   Not on file  Tobacco Use   Smoking status: Never    Passive exposure: Current (occassionally)   Smokeless tobacco: Never  Vaping Use   Vaping status: Never Used  Substance and Sexual Activity   Alcohol use: Yes    Alcohol/week: 1.0 standard drink of alcohol    Types: 1 Cans of beer per week    Comment: rarely   Drug use: Never   Sexual activity: Yes    Birth control/protection: Pill  Other Topics Concern   Not on file   Social History Narrative   Not on file   Social Drivers of Health   Financial Resource Strain: Medium Risk (11/30/2022)   Overall Financial Resource Strain (CARDIA)    Difficulty of Paying Living Expenses: Somewhat hard  Food Insecurity: Medium Risk (10/13/2023)   Received from [facility]   Hunger Vital Sign    Worried About Running Out of Food in the Last Year: Sometimes true    Ran Out of Food in the Last Year: Sometimes true  Transportation Needs: No Transportation Needs (10/13/2023)   Received from [facility]    In the past 12 months, has lack of reliable transportation kept you from medical appointments, meetings, work or from getting things needed for daily living? : No  Physical Activity: Sufficiently Active (11/30/2022)   Exercise Vital Sign    Days of Exercise per Week: 3 days    Minutes of Exercise per Session: 90 min  Stress: No Stress Concern Present (11/30/2022)   [employer] of Occupational Health - Occupational Stress Questionnaire    Feeling of Stress : Only a little  Social Connections: Unknown (11/30/2022)   Social Connection and Isolation Panel [NHANES]    Frequency of Communication  with Friends and Family: Patient declined    Frequency of Social Gatherings with Friends and Family: Patient declined    Attends Religious Services: Patient declined    Database administrator or Organizations: Yes    Attends Engineer, structural: More than 4 times per year    Marital Status: Married   Allergies  Allergen Reactions   Peanut Oil Anaphylaxis   Amoxicillin Hives   Depakote Er  [Divalproex Sodium Er] Other (See Comments)    Liver failure   Milk (Cow) Other (See Comments)    Breathing problems   Onion Other (See Comments)    breathing   Paroxetine Other (See Comments)    nausea   Penicillins Hives   Aspartame Nausea And Vomiting   Lamotrigine     Other reaction(s): Loss of Consciousness Elevated LFTs   Sucralose     Other  reaction(s): GI Upset (intolerance)   Tilactase     Other reaction(s): Respiratory Distress (ALLERGY/intolerance) wheezing   Wheat     Other reaction(s): Respiratory Distress (ALLERGY/intolerance)   Egg-Derived Products Other (See Comments)   Family History  Problem Relation Age of Onset   Ulcers Mother    Healthy Father    Ulcerative colitis Sister     Current Outpatient Medications (Endocrine & Metabolic):    JUNEL 1/20 1-20 MG-MCG tablet, Take 1 tablet by mouth daily.  Current Outpatient Medications (Cardiovascular):    EPINEPHrine 0.3 mg/0.3 mL IJ SOAJ injection, Inject into the muscle.  Current Outpatient Medications (Respiratory):    albuterol (VENTOLIN HFA) 108 (90 Base) MCG/ACT inhaler, Inhale 1-2 puffs into the lungs every 6 (six) hours as needed for wheezing or shortness of breath.   beclomethasone (QVAR REDIHALER) 40 MCG/ACT inhaler, Inhale 2 puffs into the lungs 2 (two) times daily.   fexofenadine (ALLEGRA) 180 MG tablet, Take by mouth.   triamcinolone (NASACORT) 55 MCG/ACT AERO nasal inhaler, Place 1 spray into the nose daily. Start with 1 spray each side twice a day for 3 days, then reduce to daily.  Current Outpatient Medications (Analgesics):    Rimegepant Sulfate (NURTEC) 75 MG TBDP, Take 1 tablet (75 mg total) by mouth daily.   UBRELVY 50 MG TABS, Take 1 tablet (50 mg total) by mouth as needed (when migraine starts). May repeat in 2 hours if headache persists.   Current Outpatient Medications (Other):    amphetamine-dextroamphetamine (ADDERALL XR) 15 MG 24 hr capsule, Take 1 capsule by mouth every morning.   amphetamine-dextroamphetamine (ADDERALL XR) 15 MG 24 hr capsule, Take 1 capsule by mouth every morning.   amphetamine-dextroamphetamine (ADDERALL XR) 15 MG 24 hr capsule, Take 1 capsule by mouth every morning.   amphetamine-dextroamphetamine (ADDERALL) 15 MG tablet, Take 1 tablet by mouth daily after lunch.   amphetamine-dextroamphetamine (ADDERALL) 15 MG  tablet, Take 1 tablet by mouth daily before breakfast.   escitalopram (LEXAPRO) 20 MG tablet, Take 1 tablet (20 mg total) by mouth daily.   ondansetron (ZOFRAN-ODT) 8 MG disintegrating tablet, Take 1 tablet (8 mg total) by mouth every 8 (eight) hours as needed for nausea.   tiZANidine (ZANAFLEX) 2 MG tablet, TAKE 1 TABLET BY MOUTH EVERYDAY AT BEDTIME   topiramate (TOPAMAX) 50 MG tablet, Take 1 tablet (50 mg total) by mouth 2 (two) times daily.   Reviewed prior external information including notes and imaging from  primary care provider As well as notes that were available from care everywhere and other healthcare systems.  Past medical history, social, surgical and  family history all reviewed in electronic medical record.  No pertanent information unless stated regarding to the chief complaint.   Review of Systems:  No headache, visual changes, nausea, vomiting, diarrhea, constipation, dizziness, abdominal pain, skin rash, fevers, chills, night sweats, weight loss, swollen lymph nodes, body aches, joint swelling, chest pain, shortness of breath, mood changes. POSITIVE muscle aches  Objective  There were no vitals taken for this visit.   General: No apparent distress alert and oriented x3 mood and affect normal, dressed appropriately.  HEENT: Pupils equal, extraocular movements intact  Respiratory: Patient's speak in full sentences and does not appear short of breath  Cardiovascular: No lower extremity edema, non tender, no erythema  Left hip exam shows increase discomfort noted with internal rotation.  Lacks 5 degrees of internal rotation compared to the contralateral side.  Mild discomfort over the gluteal area on the left side.  Negative straight leg test noted.  Procedure: Real-time Ultrasound Guided Injection of left intra-articular hip Device: GE Logiq Q7  Ultrasound guided injection is preferred based studies that show increased duration, increased effect, greater accuracy, decreased  procedural pain, increased response rate with ultrasound guided versus blind injection.  Verbal informed consent obtained.  Time-out conducted.  Noted no overlying erythema, induration, or other signs of local infection.  Skin prepped in a sterile fashion.  Local anesthesia: Topical Ethyl chloride.  With sterile technique and under real time ultrasound guidance:  Anterior capsule visualized, needle visualized going to the head neck junction at the anterior capsule. Pictures taken. Patient did have injection of 2 cc of 0.5% Marcaine, and 1 cc of Kenalog 40 mg/dL. Completed without difficulty  Pain immediately resolved suggesting accurate placement of the medication.  Still had some discomfort in the gluteal area. Advised to call if fevers/chills, erythema, induration, drainage, or persistent bleeding.  Images permanently stored Impression: Technically successful ultrasound guided injection.    Impression and Recommendations:     The above documentation has been reviewed and is accurate and complete Judi Saa, DO

## 2023-12-07 NOTE — Patient Instructions (Signed)
 Injected hip today Can skate in 72 hours Kick butt in 2 weeks See me in 2 months in case not better

## 2023-12-12 DIAGNOSIS — G43901 Migraine, unspecified, not intractable, with status migrainosus: Secondary | ICD-10-CM | POA: Diagnosis not present

## 2024-01-01 ENCOUNTER — Other Ambulatory Visit: Payer: Self-pay | Admitting: Family Medicine

## 2024-01-01 DIAGNOSIS — F419 Anxiety disorder, unspecified: Secondary | ICD-10-CM

## 2024-01-04 DIAGNOSIS — G43909 Migraine, unspecified, not intractable, without status migrainosus: Secondary | ICD-10-CM | POA: Diagnosis not present

## 2024-01-06 ENCOUNTER — Ambulatory Visit: Admitting: Family Medicine

## 2024-01-10 DIAGNOSIS — Z8279 Family history of other congenital malformations, deformations and chromosomal abnormalities: Secondary | ICD-10-CM | POA: Diagnosis not present

## 2024-01-10 DIAGNOSIS — F32A Depression, unspecified: Secondary | ICD-10-CM | POA: Diagnosis not present

## 2024-01-10 DIAGNOSIS — F419 Anxiety disorder, unspecified: Secondary | ICD-10-CM | POA: Diagnosis not present

## 2024-01-10 DIAGNOSIS — F909 Attention-deficit hyperactivity disorder, unspecified type: Secondary | ICD-10-CM | POA: Diagnosis not present

## 2024-01-10 DIAGNOSIS — G43709 Chronic migraine without aura, not intractable, without status migrainosus: Secondary | ICD-10-CM | POA: Diagnosis not present

## 2024-02-01 NOTE — Progress Notes (Signed)
 [facility] [address] [place] [address] Phone: [phone] Subjective:   Tammy Cunningham, am serving as a scribe for Dr. Ronnell Coins.  I'm seeing this patient by the request  of:  Patient, No Pcp Per  CC: Left hip pain follow-up  [id number]  12/07/2023 Plan ultrasound defect was noted.  Patient given injection and tolerated the procedure well, discussed icing regimen and home exercises, discussed which activities to do and which ones to avoid.  Increase activity slowly otherwise.  Follow-up again in 6 to 8 weeks.  Patient would like to avoid surgical intervention if possible.  I do think it is potentially possible     Update 02/08/2024 Tammy Cunningham is a 38 y.o. female coming in with complaint of L hip pain.  Given an intra-articular hip injection at last exam.  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  Patient was to restart some home exercises.  Patient states that injection helped. Pain in lower back and glute with slight pain in joint. Less IR ROM.       Past Medical History:  Diagnosis Date   Allergy    Anxiety    Asthma    Depression    GAD (generalized anxiety disorder) 05/08/2015   High risk medication use 01/21/2015   Lipid screening 09/05/2020   Major depression in complete remission (HCC) 05/08/2015   Neck pain 05/27/2021   Seizures (HCC)    Unexplained weight loss 02/10/2013   Past Surgical History:  Procedure Laterality Date   HIP SURGERY Right    2019   OVARIAN CYST REMOVAL     2005   Social History   Socioeconomic History   Marital status: Married    Spouse name: Not on file   Number of children: Not on file   Years of education: Not on file   Highest education level: Doctorate  Occupational History   Not on file  Tobacco Use   Smoking status: Never    Passive exposure: Current (occassionally)   Smokeless tobacco: Never  Vaping Use   Vaping status: Never Used  Substance and Sexual Activity   Alcohol use: Yes     Alcohol/week: 1.0 standard drink of alcohol    Types: 1 Cans of beer per week    Comment: rarely   Drug use: Never   Sexual activity: Yes    Birth control/protection: Pill  Other Topics Concern   Not on file  Social History Narrative   Not on file   Social Drivers of Health   Financial Resource Strain: Medium Risk (11/30/2022)   Overall Financial Resource Strain (CARDIA)    Difficulty of Paying Living Expenses: Somewhat hard  Food Insecurity: Low Risk  (01/10/2024)   Received from [facility]   Hunger Vital Sign    Worried About Running Out of Food in the Last Year: Never true    Ran Out of Food in the Last Year: Never true  Recent Concern: Food Insecurity - Medium Risk (10/13/2023)   Received from [facility]   Hunger Vital Sign    Worried About Running Out of Food in the Last Year: Sometimes true    Ran Out of Food in the Last Year: Sometimes true  Transportation Needs: No Transportation Needs (01/10/2024)   Received from [facility]    In the past 12 months, has lack of reliable transportation kept you from medical appointments, meetings, work or from getting things needed for daily living? :  No  Physical Activity: Sufficiently Active (11/30/2022)   Exercise Vital Sign    Days of Exercise per Week: 3 days    Minutes of Exercise per Session: 90 min  Stress: No Stress Concern Present (11/30/2022)   [employer] of Occupational Health - Occupational Stress Questionnaire    Feeling of Stress : Only a little  Social Connections: Unknown (11/30/2022)   Social Connection and Isolation Panel [NHANES]    Frequency of Communication with Friends and Family: Patient declined    Frequency of Social Gatherings with Friends and Family: Patient declined    Attends Religious Services: Patient declined    Database administrator or Organizations: Yes    Attends Engineer, structural: More than 4 times per year    Marital Status: Married   Allergies   Allergen Reactions   Peanut Oil Anaphylaxis   Amoxicillin Hives   Depakote Er  [Divalproex Sodium Er] Other (See Comments)    Liver failure   Milk (Cow) Other (See Comments)    Breathing problems   Onion Other (See Comments)    breathing   Paroxetine Other (See Comments)    nausea   Penicillins Hives   Aspartame Nausea And Vomiting   Lamotrigine     Other reaction(s): Loss of Consciousness Elevated LFTs   Sucralose     Other reaction(s): GI Upset (intolerance)   Tilactase     Other reaction(s): Respiratory Distress (ALLERGY/intolerance) wheezing   Wheat     Other reaction(s): Respiratory Distress (ALLERGY/intolerance)   Egg-Derived Products Other (See Comments)   Family History  Problem Relation Age of Onset   Ulcers Mother    Healthy Father    Ulcerative colitis Sister     Current Outpatient Medications (Endocrine & Metabolic):    JUNEL  1/20 1-20 MG-MCG tablet, Take 1 tablet by mouth daily.  Current Outpatient Medications (Cardiovascular):    EPINEPHrine  0.3 mg/0.3 mL IJ SOAJ injection, Inject into the muscle.  Current Outpatient Medications (Respiratory):    albuterol (VENTOLIN HFA) 108 (90 Base) MCG/ACT inhaler, Inhale 1-2 puffs into the lungs every 6 (six) hours as needed for wheezing or shortness of breath.   beclomethasone (QVAR REDIHALER) 40 MCG/ACT inhaler, Inhale 2 puffs into the lungs 2 (two) times daily.   fexofenadine (ALLEGRA) 180 MG tablet, Take by mouth.   triamcinolone  (NASACORT ) 55 MCG/ACT AERO nasal inhaler, Place 1 spray into the nose daily. Start with 1 spray each side twice a day for 3 days, then reduce to daily.  Current Outpatient Medications (Analgesics):    Rimegepant Sulfate (NURTEC) 75 MG TBDP, Take 1 tablet (75 mg total) by mouth daily.   UBRELVY  50 MG TABS, Take 1 tablet (50 mg total) by mouth as needed (when migraine starts). May repeat in 2 hours if headache persists.   Current Outpatient Medications (Other):     amphetamine -dextroamphetamine  (ADDERALL XR) 15 MG 24 hr capsule, Take 1 capsule by mouth every morning.   escitalopram  (LEXAPRO ) 20 MG tablet, Take 1 tablet (20 mg total) by mouth daily.   ondansetron  (ZOFRAN -ODT) 8 MG disintegrating tablet, Take 1 tablet (8 mg total) by mouth every 8 (eight) hours as needed for nausea.   tiZANidine  (ZANAFLEX ) 2 MG tablet, TAKE 1 TABLET BY MOUTH EVERYDAY AT BEDTIME   topiramate  (TOPAMAX ) 50 MG tablet, Take 1 tablet (50 mg total) by mouth 2 (two) times daily.   amphetamine -dextroamphetamine  (ADDERALL XR) 15 MG 24 hr capsule, Take 1 capsule by mouth every morning.   amphetamine -dextroamphetamine  (ADDERALL  XR) 15 MG 24 hr capsule, Take 1 capsule by mouth every morning.   amphetamine -dextroamphetamine  (ADDERALL) 15 MG tablet, Take 1 tablet by mouth daily after lunch.   amphetamine -dextroamphetamine  (ADDERALL) 15 MG tablet, Take 1 tablet by mouth daily before breakfast.   Reviewed prior external information including notes and imaging from  primary care provider As well as notes that were available from care everywhere and other healthcare systems.  Past medical history, social, surgical and family history all reviewed in electronic medical record.  No pertanent information unless stated regarding to the chief complaint.   Review of Systems:  No headache, visual changes, nausea, vomiting, diarrhea, constipation, dizziness, abdominal pain, skin rash, fevers, chills, night sweats, weight loss, swollen lymph nodes, body aches, joint swelling, chest pain, shortness of breath, mood changes. POSITIVE muscle aches  Objective  Blood pressure 118/82, pulse 97, height 5\' 7"  (1.702 m), weight 152 lb (68.9 kg), SpO2 99%.   General: No apparent distress alert and oriented x3 mood and affect normal, dressed appropriately.  HEENT: Pupils equal, extraocular movements intact  Respiratory: Patient's speak in full sentences and does not appear short of breath  Cardiovascular: No  lower extremity edema, non tender, no erythema  Left hip exam shows patient does have some improvement in range of motion.  Able to get up from a seated position without any significant difficulty.     Impression and Recommendations:     The above documentation has been reviewed and is accurate and complete Oma Marzan M Carime Dinkel, DO

## 2024-02-08 ENCOUNTER — Ambulatory Visit (INDEPENDENT_AMBULATORY_CARE_PROVIDER_SITE_OTHER)

## 2024-02-08 ENCOUNTER — Ambulatory Visit: Admitting: Family Medicine

## 2024-02-08 ENCOUNTER — Other Ambulatory Visit: Payer: Self-pay

## 2024-02-08 VITALS — BP 118/82 | HR 97 | Ht 67.0 in | Wt 152.0 lb

## 2024-02-08 DIAGNOSIS — M25552 Pain in left hip: Secondary | ICD-10-CM

## 2024-02-08 DIAGNOSIS — S73192D Other sprain of left hip, subsequent encounter: Secondary | ICD-10-CM | POA: Diagnosis not present

## 2024-02-08 DIAGNOSIS — M5127 Other intervertebral disc displacement, lumbosacral region: Secondary | ICD-10-CM | POA: Diagnosis not present

## 2024-02-08 DIAGNOSIS — M549 Dorsalgia, unspecified: Secondary | ICD-10-CM | POA: Diagnosis not present

## 2024-02-08 NOTE — Assessment & Plan Note (Signed)
 Patient seems to be doing bit better with this.  Sitting comfortably even with her legs crossed.  Little concerned though that there is a possibility of a lumbar radiculopathy that could be contributing to more of the posterior pain.  Discussed potentially x-rays and even advanced imaging.  Patient wants to see how she responds to the conservative therapy.  Hold on any other type of aggressive therapies.  Would like to see if patient can continue to make progress.  Discussed icing regimen and home exercises otherwise.  Follow-up again in 6 to 8 weeks

## 2024-02-08 NOTE — Patient Instructions (Addendum)
 Xray today Balance and hip abductor strengthening See you again in 3 months If you need MRI sooner write us  in MyChart

## 2024-02-14 ENCOUNTER — Ambulatory Visit: Payer: Self-pay | Admitting: Family Medicine

## 2024-03-01 ENCOUNTER — Other Ambulatory Visit: Payer: Self-pay | Admitting: Family

## 2024-03-01 DIAGNOSIS — N926 Irregular menstruation, unspecified: Secondary | ICD-10-CM

## 2024-03-01 NOTE — Telephone Encounter (Signed)
 Please review refill request

## 2024-03-05 NOTE — Telephone Encounter (Signed)
 Please schedule pt for OV, thanks.

## 2024-03-06 NOTE — Telephone Encounter (Signed)
 Pcp no longer Tammy Cunningham . Pt currently sees [facility]

## 2024-03-13 ENCOUNTER — Other Ambulatory Visit (HOSPITAL_COMMUNITY): Payer: Self-pay

## 2024-03-16 DIAGNOSIS — Z8489 Family history of other specified conditions: Secondary | ICD-10-CM | POA: Diagnosis not present

## 2024-03-16 DIAGNOSIS — Z8481 Family history of carrier of genetic disease: Secondary | ICD-10-CM | POA: Diagnosis not present

## 2024-03-16 DIAGNOSIS — Z85820 Personal history of malignant melanoma of skin: Secondary | ICD-10-CM | POA: Diagnosis not present

## 2024-03-20 DIAGNOSIS — R079 Chest pain, unspecified: Secondary | ICD-10-CM | POA: Diagnosis not present

## 2024-05-09 NOTE — Progress Notes (Unsigned)
 [facility] [address] [place] [address] Phone: [phone] Subjective:   Tammy Cunningham, am serving as a scribe for Dr. Arthea Claudene.  I'm seeing this patient by the request  of:  Patient, No Pcp Per  CC: Low back pain  YEP:Dlagzrupcz  02/08/2024 Patient seems to be doing bit better with this.  Sitting comfortably even with her legs crossed.  Little concerned though that there is a possibility of a lumbar radiculopathy that could be contributing to more of the posterior pain.  Discussed potentially x-rays and even advanced imaging.  Patient wants to see how she responds to the conservative therapy.  Hold on any other type of aggressive therapies.  Would like to see if patient can continue to make progress.  Discussed icing regimen and home exercises otherwise.  Follow-up again in 6 to 8 weeks      Update 05/10/2024 Tammy Cunningham is a 38 y.o. female coming in with complaint of L hip pain. Patient states that she was having symptoms that radiated down the leg in July but that seems to not occur as much. When her lower back hurts she has radiating symptoms and this is precipitated by standing in one spot. Does have pain in front of L hip intermittently.        Past Medical History:  Diagnosis Date   Allergy    Anxiety    Asthma    Depression    GAD (generalized anxiety disorder) 05/08/2015   High risk medication use 01/21/2015   Lipid screening 09/05/2020   Major depression in complete remission (HCC) 05/08/2015   Neck pain 05/27/2021   Seizures (HCC)    Unexplained weight loss 02/10/2013   Past Surgical History:  Procedure Laterality Date   HIP SURGERY Right    2019   OVARIAN CYST REMOVAL     2005   Social History   Socioeconomic History   Marital status: Married    Spouse name: Not on file   Number of children: Not on file   Years of education: Not on file   Highest education level: Doctorate  Occupational History   Not on file   Tobacco Use   Smoking status: Never    Passive exposure: Current (occassionally)   Smokeless tobacco: Never  Vaping Use   Vaping status: Never Used  Substance and Sexual Activity   Alcohol use: Yes    Alcohol/week: 1.0 standard drink of alcohol    Types: 1 Cans of beer per week    Comment: rarely   Drug use: Never   Sexual activity: Yes    Birth control/protection: Pill  Other Topics Concern   Not on file  Social History Narrative   Not on file   Social Drivers of Health   Financial Resource Strain: Medium Risk (11/30/2022)   Overall Financial Resource Strain (CARDIA)    Difficulty of Paying Living Expenses: Somewhat hard  Food Insecurity: Low Risk  (01/10/2024)   Received from [facility]   Hunger Vital Sign    Within the past 12 months, you worried that your food would run out before you got money to buy more: Never true    Within the past 12 months, the food you bought just didn't last and you didn't have money to get more. : Never true  Recent Concern: Food Insecurity - Medium Risk (10/13/2023)   Received from [facility]   Hunger Vital Sign    Worried About Running Out of  Food in the Last Year: Sometimes true    Ran Out of Food in the Last Year: Sometimes true  Transportation Needs: No Transportation Needs (01/10/2024)   Received from [facility]    In the past 12 months, has lack of reliable transportation kept you from medical appointments, meetings, work or from getting things needed for daily living? : No  Physical Activity: Sufficiently Active (11/30/2022)   Exercise Vital Sign    Days of Exercise per Week: 3 days    Minutes of Exercise per Session: 90 min  Stress: No Stress Concern Present (11/30/2022)   [employer] of Occupational Health - Occupational Stress Questionnaire    Feeling of Stress : Only a little  Social Connections: Unknown (11/30/2022)   Social Connection and Isolation Panel    Frequency of Communication with Friends and  Family: Patient declined    Frequency of Social Gatherings with Friends and Family: Patient declined    Attends Religious Services: Patient declined    Database administrator or Organizations: Yes    Attends Engineer, structural: More than 4 times per year    Marital Status: Married   Allergies  Allergen Reactions   Peanut Oil Anaphylaxis   Amoxicillin Hives   Depakote Er  [Divalproex Sodium Er] Other (See Comments)    Liver failure   Milk (Cow) Other (See Comments)    Breathing problems   Onion Other (See Comments)    breathing   Paroxetine Other (See Comments)    nausea   Penicillins Hives   Aspartame Nausea And Vomiting   Lamotrigine     Other reaction(s): Loss of Consciousness Elevated LFTs   Sucralose     Other reaction(s): GI Upset (intolerance)   Tilactase     Other reaction(s): Respiratory Distress (ALLERGY/intolerance) wheezing   Wheat     Other reaction(s): Respiratory Distress (ALLERGY/intolerance)   Egg-Derived Products Other (See Comments)   Family History  Problem Relation Age of Onset   Ulcers Mother    Healthy Father    Ulcerative colitis Sister     Current Outpatient Medications (Endocrine & Metabolic):    JUNEL  1/20 1-20 MG-MCG tablet, Take 1 tablet by mouth daily.  Current Outpatient Medications (Cardiovascular):    EPINEPHrine  0.3 mg/0.3 mL IJ SOAJ injection, Inject into the muscle.  Current Outpatient Medications (Respiratory):    albuterol (VENTOLIN HFA) 108 (90 Base) MCG/ACT inhaler, Inhale 1-2 puffs into the lungs every 6 (six) hours as needed for wheezing or shortness of breath.   beclomethasone (QVAR REDIHALER) 40 MCG/ACT inhaler, Inhale 2 puffs into the lungs 2 (two) times daily.   fexofenadine (ALLEGRA) 180 MG tablet, Take by mouth.   triamcinolone  (NASACORT ) 55 MCG/ACT AERO nasal inhaler, Place 1 spray into the nose daily. Start with 1 spray each side twice a day for 3 days, then reduce to daily.  Current Outpatient Medications  (Analgesics):    Rimegepant Sulfate (NURTEC) 75 MG TBDP, Take 1 tablet (75 mg total) by mouth daily.   UBRELVY  50 MG TABS, Take 1 tablet (50 mg total) by mouth as needed (when migraine starts). May repeat in 2 hours if headache persists.   Current Outpatient Medications (Other):    amphetamine -dextroamphetamine  (ADDERALL XR) 15 MG 24 hr capsule, Take 1 capsule by mouth every morning.   amphetamine -dextroamphetamine  (ADDERALL XR) 15 MG 24 hr capsule, Take 1 capsule by mouth every morning.   amphetamine -dextroamphetamine  (ADDERALL XR) 15 MG 24 hr capsule, Take 1 capsule by  mouth every morning.   amphetamine -dextroamphetamine  (ADDERALL) 15 MG tablet, Take 1 tablet by mouth daily after lunch.   amphetamine -dextroamphetamine  (ADDERALL) 15 MG tablet, Take 1 tablet by mouth daily before breakfast.   escitalopram  (LEXAPRO ) 20 MG tablet, Take 1 tablet (20 mg total) by mouth daily.   ondansetron  (ZOFRAN -ODT) 8 MG disintegrating tablet, Take 1 tablet (8 mg total) by mouth every 8 (eight) hours as needed for nausea.   tiZANidine  (ZANAFLEX ) 2 MG tablet, TAKE 1 TABLET BY MOUTH EVERYDAY AT BEDTIME   topiramate  (TOPAMAX ) 50 MG tablet, Take 1 tablet (50 mg total) by mouth 2 (two) times daily.   Reviewed prior external information including notes and imaging from  primary care provider As well as notes that were available from care everywhere and other healthcare systems.  Past medical history, social, surgical and family history all reviewed in electronic medical record.  No pertanent information unless stated regarding to the chief complaint.   Review of Systems:  No headache, visual changes, nausea, vomiting, diarrhea, constipation, dizziness, abdominal pain, skin rash, fevers, chills, night sweats, weight loss, swollen lymph nodes, body aches, joint swelling, chest pain, shortness of breath, mood changes. POSITIVE muscle aches  Objective  Blood pressure 120/86, pulse 99, height 5' 7 (1.702 m), weight  150 lb (68 kg), SpO2 99%.   General: No apparent distress alert and oriented x3 mood and affect normal, dressed appropriately.  HEENT: Pupils equal, extraocular movements intact  Respiratory: Patient's speak in full sentences and does not appear short of breath  Cardiovascular: No lower extremity edema, non tender, no erythema  Left positive SLT positive faber  Patient's left hip actually though has good range of motion otherwise.  No weakness noted with plantarflexion with 4 out of 5 strength compared to the contralateral side.  Deep tendon reflexes of the knee of the Achilles is 1+ on the left compared to the right.    Impression and Recommendations:    The above documentation has been reviewed and is accurate and complete Tammy Quest M Kimia Finan, DO

## 2024-05-10 ENCOUNTER — Other Ambulatory Visit: Payer: Self-pay

## 2024-05-10 ENCOUNTER — Ambulatory Visit (INDEPENDENT_AMBULATORY_CARE_PROVIDER_SITE_OTHER): Admitting: Family Medicine

## 2024-05-10 VITALS — BP 120/86 | HR 99 | Ht 67.0 in | Wt 150.0 lb

## 2024-05-10 DIAGNOSIS — M4317 Spondylolisthesis, lumbosacral region: Secondary | ICD-10-CM | POA: Insufficient documentation

## 2024-05-10 DIAGNOSIS — F419 Anxiety disorder, unspecified: Secondary | ICD-10-CM | POA: Diagnosis not present

## 2024-05-10 DIAGNOSIS — F909 Attention-deficit hyperactivity disorder, unspecified type: Secondary | ICD-10-CM | POA: Diagnosis not present

## 2024-05-10 DIAGNOSIS — M545 Low back pain, unspecified: Secondary | ICD-10-CM | POA: Diagnosis not present

## 2024-05-10 DIAGNOSIS — M25552 Pain in left hip: Secondary | ICD-10-CM | POA: Diagnosis not present

## 2024-05-10 DIAGNOSIS — F32A Depression, unspecified: Secondary | ICD-10-CM | POA: Diagnosis not present

## 2024-05-10 DIAGNOSIS — G43709 Chronic migraine without aura, not intractable, without status migrainosus: Secondary | ICD-10-CM | POA: Diagnosis not present

## 2024-05-10 NOTE — Patient Instructions (Signed)
 MRI lumbar spine We will be in touch

## 2024-05-10 NOTE — Assessment & Plan Note (Addendum)
 Grade 2 noted on x-ray.  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  Concern of the longevity of this as well as some of the instability of the leg and affecting daily activities as well as sleep.  Patient has done great with all other therapies but now has enough that is affecting daily activities and sleep.  No longer responding to the conservative therapy.  Do feel that MRI is necessary to rule out a nerve impingement.  Could be a candidate for potential epidurals, as well as the possibility of nerve root injections if necessary.  Patient is in agreement with plan and will follow-up after MRI to discuss.  Has failed formal physical therapy for hip and back previously.  Greater than 12 weeks of rehab

## 2024-05-15 ENCOUNTER — Ambulatory Visit (INDEPENDENT_AMBULATORY_CARE_PROVIDER_SITE_OTHER)

## 2024-05-15 DIAGNOSIS — M5127 Other intervertebral disc displacement, lumbosacral region: Secondary | ICD-10-CM | POA: Diagnosis not present

## 2024-05-15 DIAGNOSIS — M48061 Spinal stenosis, lumbar region without neurogenic claudication: Secondary | ICD-10-CM | POA: Diagnosis not present

## 2024-05-15 DIAGNOSIS — M545 Low back pain, unspecified: Secondary | ICD-10-CM

## 2024-05-15 DIAGNOSIS — M5137 Other intervertebral disc degeneration, lumbosacral region with discogenic back pain only: Secondary | ICD-10-CM | POA: Diagnosis not present

## 2024-05-16 ENCOUNTER — Ambulatory Visit: Payer: Self-pay | Admitting: Family Medicine

## 2024-05-16 ENCOUNTER — Other Ambulatory Visit: Payer: Self-pay

## 2024-05-16 DIAGNOSIS — M5416 Radiculopathy, lumbar region: Secondary | ICD-10-CM

## 2024-05-23 NOTE — Discharge Instructions (Signed)

## 2024-05-24 ENCOUNTER — Inpatient Hospital Stay
Admission: RE | Admit: 2024-05-24 | Discharge: 2024-05-24 | Disposition: A | Discharge status: Home / Self Care | Source: Ambulatory Visit | Attending: Family Medicine | Admitting: Family Medicine

## 2024-05-24 DIAGNOSIS — M5416 Radiculopathy, lumbar region: Secondary | ICD-10-CM | POA: Diagnosis not present

## 2024-05-24 MED ORDER — IOPAMIDOL (ISOVUE-M 200) INJECTION 41%
1.0000 mL | Freq: Once | INTRAMUSCULAR | Status: AC
  Administered 2024-05-24: 1 mL via EPIDURAL

## 2024-05-24 MED ORDER — METHYLPREDNISOLONE ACETATE 40 MG/ML INJ SUSP (RADIOLOG
80.0000 mg | Freq: Once | INTRAMUSCULAR | Status: AC
  Administered 2024-05-24: 80 mg via EPIDURAL

## 2024-06-21 DIAGNOSIS — M25542 Pain in joints of left hand: Secondary | ICD-10-CM | POA: Diagnosis not present

## 2024-06-23 ENCOUNTER — Ambulatory Visit: Admitting: Vascular Surgery

## 2024-06-23 ENCOUNTER — Ambulatory Visit (HOSPITAL_BASED_OUTPATIENT_CLINIC_OR_DEPARTMENT_OTHER)
Admission: RE | Admit: 2024-06-23 | Discharge: 2024-06-23 | Disposition: A | Discharge status: Home / Self Care | Source: Ambulatory Visit | Attending: Vascular Surgery | Admitting: Vascular Surgery

## 2024-06-23 ENCOUNTER — Ambulatory Visit (HOSPITAL_COMMUNITY)
Admission: RE | Admit: 2024-06-23 | Discharge: 2024-06-23 | Disposition: A | Discharge status: Home / Self Care | Source: Ambulatory Visit | Attending: Vascular Surgery | Admitting: Vascular Surgery

## 2024-06-23 ENCOUNTER — Encounter: Payer: Self-pay | Admitting: Vascular Surgery

## 2024-06-23 ENCOUNTER — Other Ambulatory Visit: Payer: Self-pay | Admitting: *Deleted

## 2024-06-23 ENCOUNTER — Encounter (HOSPITAL_COMMUNITY): Payer: Self-pay | Admitting: Orthopedic Surgery

## 2024-06-23 ENCOUNTER — Other Ambulatory Visit (HOSPITAL_COMMUNITY): Payer: Self-pay | Admitting: Orthopedic Surgery

## 2024-06-23 VITALS — BP 105/71 | HR 75 | Temp 97.8°F | Ht 67.0 in | Wt 155.0 lb

## 2024-06-23 DIAGNOSIS — L819 Disorder of pigmentation, unspecified: Secondary | ICD-10-CM | POA: Diagnosis not present

## 2024-06-23 DIAGNOSIS — M79602 Pain in left arm: Secondary | ICD-10-CM

## 2024-06-23 DIAGNOSIS — I739 Peripheral vascular disease, unspecified: Secondary | ICD-10-CM | POA: Insufficient documentation

## 2024-06-23 MED ORDER — NITROGLYCERIN 2 % TD OINT: 1.0000 [in_us] | TOPICAL_OINTMENT | Freq: Four times a day (QID) | TRANSDERMAL | 0 refills | Status: DC

## 2024-06-23 NOTE — Progress Notes (Signed)
 Patient ID: Tammy Cunningham, female   DOB: 10/26/1985, 38 y.o.   MRN: [id number]  Reason for Consult: New Patient (Initial Visit)   Referred by Alyse Agent, MD  Subjective:     HPI Tammy Cunningham is a 38 y.o. female presenting for evaluation of recent onset of left 1st, 2nd and 3rd finger pain.  She is accompanied by her wife today.  About 10 days ago on Monday or Tuesday she noticed some significant throbbing and pressure pain in the first 3 fingers.  She explains that it is somewhat similar to pain she has felt in the past when she had a a superficial phlebitis after an IV.  She has no hand, forearm or upper arm swelling.  She does not remember any particular injury that occurred although she did remember pulling something out of her dog's mouth on a walk on Monday but does not remember any pain or insect sting/bite at that time.  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  She denies any previous symptoms like this and denies a history of Raynaud's.   She denies smoking.  She denies drug use.  Past Medical History:  Diagnosis Date   Allergy    Anxiety    Asthma    Depression    GAD (generalized anxiety disorder) 05/08/2015   High risk medication use 01/21/2015   Lipid screening 09/05/2020   Major depression in complete remission 05/08/2015   Neck pain 05/27/2021   Seizures (HCC)    Unexplained weight loss 02/10/2013   Family History  Problem Relation Age of Onset   Ulcers Mother    Healthy Father    Ulcerative colitis Sister    Past Surgical History:  Procedure Laterality Date   HIP SURGERY Right    2019   OVARIAN CYST REMOVAL     2005    Short Social History:  Social History   Tobacco Use   Smoking status: Never    Passive exposure: Current (occassionally)   Smokeless tobacco: Never  Substance Use Topics   Alcohol use: Yes    Alcohol/week: 1.0 standard drink of alcohol    Types: 1 Cans of  beer per week    Comment: rarely    Allergies  Allergen Reactions   Peanut Oil Anaphylaxis   Amoxicillin Hives   Depakote Er  [Divalproex Sodium Er] Other (See Comments)    Liver failure   Milk (Cow) Other (See Comments)    Breathing problems   Onion Other (See Comments)    breathing   Paroxetine Other (See Comments)    nausea   Penicillins Hives   Aspartame Nausea And Vomiting   Lamotrigine     Other reaction(s): Loss of Consciousness Elevated LFTs   Sucralose     Other reaction(s): GI Upset (intolerance)   Tilactase     Other reaction(s): Respiratory Distress (ALLERGY/intolerance) wheezing   Wheat     Other reaction(s): Respiratory Distress (ALLERGY/intolerance)   Egg Protein-Containing Drug Products Other (See Comments)    Current Outpatient Medications  Medication Sig Dispense Refill   albuterol (VENTOLIN HFA) 108 (90 Base) MCG/ACT inhaler Inhale 1-2 puffs into the lungs every 6 (six) hours as needed for wheezing or shortness of breath.     amphetamine -dextroamphetamine  (ADDERALL XR) 15 MG 24 hr capsule Take 1 capsule by mouth every morning. 30 capsule 0   amphetamine -dextroamphetamine  (ADDERALL XR) 15 MG 24 hr capsule  Take 1 capsule by mouth every morning. 30 capsule 0   amphetamine -dextroamphetamine  (ADDERALL XR) 15 MG 24 hr capsule Take 1 capsule by mouth every morning. 30 capsule 0   amphetamine -dextroamphetamine  (ADDERALL) 15 MG tablet Take 1 tablet by mouth daily after lunch. 30 tablet 0   amphetamine -dextroamphetamine  (ADDERALL) 15 MG tablet Take 1 tablet by mouth daily before breakfast. 30 tablet 0   beclomethasone (QVAR REDIHALER) 40 MCG/ACT inhaler Inhale 2 puffs into the lungs 2 (two) times daily. 10.6 g 5   EPINEPHrine  0.3 mg/0.3 mL IJ SOAJ injection Inject into the muscle.     escitalopram  (LEXAPRO ) 20 MG tablet Take 1 tablet (20 mg total) by mouth daily. 90 tablet 1   fexofenadine (ALLEGRA) 180 MG tablet Take by mouth.     JUNEL  1/20 1-20 MG-MCG tablet  Take 1 tablet by mouth daily. 90 tablet 3   nitroGLYCERIN (NITROGLYN) 2 % ointment Apply 1 inch topically 4 (four) times daily. Apply to affected areas of finger as need for pain. Up to 4 times daily. 30 g 0   ondansetron  (ZOFRAN -ODT) 8 MG disintegrating tablet Take 1 tablet (8 mg total) by mouth every 8 (eight) hours as needed for nausea. 30 tablet 0   Rimegepant Sulfate (NURTEC) 75 MG TBDP Take 1 tablet (75 mg total) by mouth daily. 48 tablet 1   tiZANidine  (ZANAFLEX ) 2 MG tablet TAKE 1 TABLET BY MOUTH EVERYDAY AT BEDTIME 90 tablet 1   topiramate  (TOPAMAX ) 50 MG tablet Take 1 tablet (50 mg total) by mouth 2 (two) times daily. 30 tablet 2   triamcinolone  (NASACORT ) 55 MCG/ACT AERO nasal inhaler Place 1 spray into the nose daily. Start with 1 spray each side twice a day for 3 days, then reduce to daily. 1 each 2   UBRELVY  50 MG TABS Take 1 tablet (50 mg total) by mouth as needed (when migraine starts). May repeat in 2 hours if headache persists. 10 tablet 1   No current facility-administered medications for this visit.    REVIEW OF SYSTEMS  All other systems were reviewed and are negative     Objective:  Objective   Vitals:   06/23/24 1125  BP: 105/71  Pulse: 75  Temp: 97.8 F (36.6 C)  SpO2: 95%  Weight: 155 lb (70.3 kg)  Height: 5' 7 (1.702 m)   Body mass index is 24.28 kg/m.  Physical Exam General: no acute distress Cardiac: hemodynamically stable Pulm: normal work of breathing Neuro: alert, no focal deficit Extremities: Left 2nd and 3rd finger discolored and purpleish, slightly cool to touch.  No range of motion deficits.  No point tenderness over the carpal tunnel. Vascular:   Right: Palpable radial, ulnar  Left: Palpable radial, ulnar, brachial No palpable mass in supraclavicular regions  Data: Right Doppler Findings:  +----------+--------+-----+---------+--------+  Site     PressureIndexDoppler  Comments  +----------+--------+-----+---------+--------+   Subclavian            triphasic          +----------+--------+-----+---------+--------+  Axillary              triphasic          +----------+--------+-----+---------+--------+  Brachial 112          triphasic          +----------+--------+-----+---------+--------+  Forearm  127     1.06                    +----------+--------+-----+---------+--------+  Radial  triphasic          +----------+--------+-----+---------+--------+  Ulnar                 triphasic          +----------+--------+-----+---------+--------+  Digit    121     1.01                    +----------+--------+-----+---------+--------+    Left Doppler Findings:  +----------+--------+-----+----------+--------+  Site     PressureIndexDoppler   Comments  +----------+--------+-----+----------+--------+  Subclavian            triphasic           +----------+--------+-----+----------+--------+  Axillary              triphasic           +----------+--------+-----+----------+--------+  Brachial 120          triphasic           +----------+--------+-----+----------+--------+  Forearm  119     0.99                     +----------+--------+-----+----------+--------+  Radial                monophasic          +----------+--------+-----+----------+--------+  Ulnar                 triphasic           +----------+--------+-----+----------+--------+  Digit    120     1.00 monophasic          +----------+--------+-----+----------+--------+   Left arm duplex Triphasic flow throughout with some higher resistances at the radial as it enters the hand and palmar arch.     Assessment/Plan:   Tammy Cunningham is a 38 y.o. female with about a 2-week history of significant left 1st through 3rd finger pain and discoloration.  I explained that her Doppler findings demonstrate significant decrease in the waveforms in her 2nd and 3rd fingers  therefore we obtained a duplex which showed triphasic flow throughout without any areas of occlusion or stenosis.  I explained that it would be very rare for this to be related to an embolic phenomenon of the microvasculature of those 3 fingers and more likely related to a vasospastic issue either from an unknown injury or a neuropathic etiology.  We discussed Nitropaste for some symptom relief which was prescribed and otherwise should continue to use Advil and Tylenol as needed. Recommend following back up with the hand specialist she initially saw as we have ruled out a true vascular cause.   Norman GORMAN Serve MD [facility]

## 2024-06-26 ENCOUNTER — Encounter: Payer: Self-pay | Admitting: Vascular Surgery

## 2024-06-26 DIAGNOSIS — G5603 Carpal tunnel syndrome, bilateral upper limbs: Secondary | ICD-10-CM | POA: Diagnosis not present

## 2024-06-26 DIAGNOSIS — I739 Peripheral vascular disease, unspecified: Secondary | ICD-10-CM | POA: Diagnosis not present

## 2024-06-26 MED ORDER — NITROGLYCERIN 2 % TD OINT: 1.0000 [in_us] | TOPICAL_OINTMENT | Freq: Four times a day (QID) | TRANSDERMAL | 0 refills | Status: AC

## 2024-07-02 DIAGNOSIS — G5603 Carpal tunnel syndrome, bilateral upper limbs: Secondary | ICD-10-CM | POA: Diagnosis not present

## 2024-07-03 NOTE — Progress Notes (Unsigned)
 [facility] [address] [place] [address] Phone: [phone] Subjective:   Tammy Cunningham, am serving as a scribe for Dr. Arthea Claudene.  I'm seeing this patient by the request  of:  Tammy Cunningham  CC: Low back pain  YEP:Dlagzrupcz  05/10/2024 Grade 2 noted on x-ray.  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  Concern of the longevity of this as well as some of the instability of the leg and affecting daily activities as well as sleep.  Patient has done great with all other therapies but now has enough that is affecting daily activities and sleep.  No longer responding to the conservative therapy.  Do feel that MRI is necessary to rule out a nerve impingement.  Could be a candidate for potential epidurals, as well as the possibility of nerve root injections if necessary.  Patient is in agreement with plan and will follow-up after MRI to discuss.  Has failed formal physical therapy for hip and back previously.  Greater than 12 weeks of rehab      Update 07/05/2024 Tammy Cunningham is a 38 y.o. female coming in with complaint of lumbar spine pain. Epidural 05/24/2024. Patient states epidural helped. Still having some pain, but no longer radiating.  MRI lumbar spine 05/15/2024 IMPRESSION: 1. 5 mm retrolisthesis of L5-S1 with associated degenerative disc bulging and facet hypertrophy, resulting in mild left L5 foraminal stenosis. 2. Mild bilateral facet hypertrophy at L3-4 and L4-5 without stenosis.     Past Medical History:  Diagnosis Date   Allergy    Anxiety    Asthma    Depression    GAD (generalized anxiety disorder) 05/08/2015   High risk medication use 01/21/2015   Lipid screening 09/05/2020   Major depression in complete remission 05/08/2015   Neck pain 05/27/2021   Seizures (HCC)    Unexplained weight loss 02/10/2013   Past Surgical History:  Procedure Laterality Date   HIP SURGERY Right    2019    OVARIAN CYST REMOVAL     2005   Social History   Socioeconomic History   Marital status: Married    Spouse name: Not on file   Number of children: Not on file   Years of education: Not on file   Highest education level: Doctorate  Occupational History   Not on file  Tobacco Use   Smoking status: Never    Passive exposure: Current (occassionally)   Smokeless tobacco: Never  Vaping Use   Vaping status: Never Used  Substance and Sexual Activity   Alcohol use: Yes    Alcohol/week: 1.0 standard drink of alcohol    Types: 1 Cans of beer Cunningham week    Comment: rarely   Drug use: Never   Sexual activity: Yes    Birth control/protection: Pill  Other Topics Concern   Not on file  Social History Narrative   Not on file   Social Drivers of Health   Financial Resource Strain: Medium Risk (11/30/2022)   Overall Financial Resource Strain (CARDIA)    Difficulty of Paying Living Expenses: Somewhat hard  Food Insecurity: Unknown (05/10/2024)   Received from [facility]   Hunger Vital Sign    Within the past 12 months, you worried that your food would run out before you got money to buy more: Patient declined to answer    Within the past 12 months, the food you bought just didn't last and you  didn't have money to get more. : Patient declined to answer  Transportation Needs: Not on file (05/10/2024)  Physical Activity: Sufficiently Active (11/30/2022)   Exercise Vital Sign    Days of Exercise Cunningham Week: 3 days    Minutes of Exercise Cunningham Session: 90 min  Stress: No Stress Concern Present (11/30/2022)   Tammy Cunningham    Feeling of Stress : Only a little  Social Connections: Unknown (11/30/2022)   Social Connection and Isolation Panel    Frequency of Communication with Friends and Family: Patient declined    Frequency of Social Gatherings with Friends and Family: Patient declined    Attends Religious Services: Patient declined     Database Administrator or Organizations: Yes    Attends Engineer, Structural: More than 4 times Cunningham year    Marital Status: Married   Allergies  Allergen Reactions   Peanut Oil Anaphylaxis   Amoxicillin Hives   Depakote Er  [Divalproex Sodium Er] Other (See Comments)    Liver failure   Milk (Cow) Other (See Comments)    Breathing problems   Onion Other (See Comments)    breathing   Paroxetine Other (See Comments)    nausea   Penicillins Hives   Aspartame Nausea And Vomiting   Lamotrigine     Other reaction(s): Loss of Consciousness Elevated LFTs   Sucralose     Other reaction(s): GI Upset (intolerance)   Tilactase     Other reaction(s): Respiratory Distress (ALLERGY/intolerance) wheezing   Wheat     Other reaction(s): Respiratory Distress (ALLERGY/intolerance)   Egg Protein-Containing Drug Products Other (See Comments)   Family History  Problem Relation Age of Onset   Ulcers Mother    Healthy Father    Ulcerative colitis Sister     Current Outpatient Medications (Endocrine & Metabolic):    JUNEL  1/20 1-20 MG-MCG tablet, Take 1 tablet by mouth daily.  Current Outpatient Medications (Cardiovascular):    amLODipine (NORVASC) 2.5 MG tablet, Take 1 tablet (2.5 mg total) by mouth daily.   EPINEPHrine  0.3 mg/0.3 mL IJ SOAJ injection, Inject into the muscle.   nitroGLYCERIN (NITROGLYN) 2 % ointment, Apply 1 inch topically 4 (four) times daily. Apply to affected areas of finger as need for pain. Up to 4 times daily.  Current Outpatient Medications (Respiratory):    albuterol (VENTOLIN HFA) 108 (90 Base) MCG/ACT inhaler, Inhale 1-2 puffs into the lungs every 6 (six) hours as needed for wheezing or shortness of breath.   beclomethasone (QVAR REDIHALER) 40 MCG/ACT inhaler, Inhale 2 puffs into the lungs 2 (two) times daily.   fexofenadine (ALLEGRA) 180 MG tablet, Take by mouth.   triamcinolone  (NASACORT ) 55 MCG/ACT AERO nasal inhaler, Place 1 spray into the nose daily.  Start with 1 spray each side twice a day for 3 days, then reduce to daily.  Current Outpatient Medications (Analgesics):    Rimegepant Sulfate (NURTEC) 75 MG TBDP, Take 1 tablet (75 mg total) by mouth daily.   UBRELVY  50 MG TABS, Take 1 tablet (50 mg total) by mouth as needed (when migraine starts). May repeat in 2 hours if headache persists.   Current Outpatient Medications (Other):    amphetamine -dextroamphetamine  (ADDERALL XR) 15 MG 24 hr capsule, Take 1 capsule by mouth every morning.   amphetamine -dextroamphetamine  (ADDERALL XR) 15 MG 24 hr capsule, Take 1 capsule by mouth every morning.   amphetamine -dextroamphetamine  (ADDERALL XR) 15 MG 24 hr capsule, Take 1 capsule by  mouth every morning.   amphetamine -dextroamphetamine  (ADDERALL) 15 MG tablet, Take 1 tablet by mouth daily after lunch.   amphetamine -dextroamphetamine  (ADDERALL) 15 MG tablet, Take 1 tablet by mouth daily before breakfast.   escitalopram  (LEXAPRO ) 20 MG tablet, Take 1 tablet (20 mg total) by mouth daily.   ondansetron  (ZOFRAN -ODT) 8 MG disintegrating tablet, Take 1 tablet (8 mg total) by mouth every 8 (eight) hours as needed for nausea.   tiZANidine  (ZANAFLEX ) 2 MG tablet, TAKE 1 TABLET BY MOUTH EVERYDAY AT BEDTIME   topiramate  (TOPAMAX ) 50 MG tablet, Take 1 tablet (50 mg total) by mouth 2 (two) times daily.   Reviewed prior external information including notes and imaging from  primary care provider As well as notes that were available from care everywhere and other healthcare systems.  Past medical history, social, surgical and family history all reviewed in electronic medical record.  No pertanent information unless stated regarding to the chief complaint.   Review of Systems:  No headache, visual changes, nausea, vomiting, diarrhea, constipation, dizziness, abdominal pain, skin rash, fevers, chills, night sweats, weight loss, swollen lymph nodes, body aches, joint swelling, chest pain, shortness of breath, mood  changes. POSITIVE muscle aches  Objective  Blood pressure 106/82, pulse 90, height 5' 7 (1.702 m), weight 156 lb (70.8 kg), SpO2 97%.   General: No apparent distress alert and oriented x3 mood and affect normal, dressed appropriately.  HEENT: Pupils equal, extraocular movements intact  Respiratory: Patient's speak in full sentences and does not appear short of breath  Cardiovascular: No lower extremity edema, non tender, no erythema   Back exam shows patient does have some loss of lordosis noted.  Sitting comfortably no.  Patient does have a bluish hue to the left thumb index and middle finger.    Impression and Recommendations:     The above documentation has been reviewed and is accurate and complete Montrey Buist M Kendarius Vigen, DO

## 2024-07-05 ENCOUNTER — Ambulatory Visit: Admitting: Family Medicine

## 2024-07-05 VITALS — BP 106/82 | HR 90 | Ht 67.0 in | Wt 156.0 lb

## 2024-07-05 DIAGNOSIS — M79642 Pain in left hand: Secondary | ICD-10-CM | POA: Diagnosis not present

## 2024-07-05 DIAGNOSIS — M4317 Spondylolisthesis, lumbosacral region: Secondary | ICD-10-CM

## 2024-07-05 MED ORDER — AMLODIPINE BESYLATE 2.5 MG PO TABS: 2.5000 mg | ORAL_TABLET | Freq: Every day | ORAL | 0 refills | Status: DC

## 2024-07-05 NOTE — Assessment & Plan Note (Signed)
 Left hand does seem what appears to be more of a vasospasm.  EMG was unremarkable for any carpal tunnel.  I am awaiting further evaluation by vascular and the potential MR angiogram may be necessary.  Discussed with patient about amlodipine to see if that would help with some of her symptoms.  2.5 mg given and warned of side effects.  Will follow-up with Ortho in vascular.

## 2024-07-05 NOTE — Assessment & Plan Note (Signed)
 Patient with some spondylolisthesis noted.  Has responded well to the epidural with greater than 70% improvement.  Discussed with patient that we can repeat these injections when needed.  No other medication changes needed.  Follow-up with me again as needed

## 2024-07-05 NOTE — Patient Instructions (Addendum)
 Amlodipine 2.5mg  See you again when you need me

## 2024-07-06 DIAGNOSIS — G5603 Carpal tunnel syndrome, bilateral upper limbs: Secondary | ICD-10-CM | POA: Diagnosis not present

## 2024-07-10 ENCOUNTER — Encounter: Payer: Self-pay | Admitting: Family Medicine

## 2024-07-12 ENCOUNTER — Other Ambulatory Visit: Payer: Self-pay

## 2024-07-12 DIAGNOSIS — R5383 Other fatigue: Secondary | ICD-10-CM

## 2024-07-13 ENCOUNTER — Ambulatory Visit: Payer: Self-pay | Admitting: Family Medicine

## 2024-07-13 ENCOUNTER — Other Ambulatory Visit: Payer: Self-pay | Admitting: Family Medicine

## 2024-07-13 ENCOUNTER — Other Ambulatory Visit (INDEPENDENT_AMBULATORY_CARE_PROVIDER_SITE_OTHER)

## 2024-07-13 DIAGNOSIS — R5383 Other fatigue: Secondary | ICD-10-CM

## 2024-07-13 LAB — CBC WITH DIFFERENTIAL/PLATELET
Basophils Absolute: 0 K/uL (ref 0.0–0.1)
Basophils Relative: 0.5 % (ref 0.0–3.0)
Eosinophils Absolute: 0.4 K/uL (ref 0.0–0.7)
Eosinophils Relative: 8 % — ABNORMAL HIGH (ref 0.0–5.0)
HCT: 42.3 % (ref 36.0–46.0)
Hemoglobin: 14.4 g/dL (ref 12.0–15.0)
Lymphocytes Relative: 26.6 % (ref 12.0–46.0)
Lymphs Abs: 1.3 K/uL (ref 0.7–4.0)
MCHC: 34 g/dL (ref 30.0–36.0)
MCV: 103.6 fl — ABNORMAL HIGH (ref 78.0–100.0)
Monocytes Absolute: 0.4 K/uL (ref 0.1–1.0)
Monocytes Relative: 7.9 % (ref 3.0–12.0)
Neutro Abs: 2.9 K/uL (ref 1.4–7.7)
Neutrophils Relative %: 57 % (ref 43.0–77.0)
Platelets: 284 K/uL (ref 150.0–400.0)
RBC: 4.08 Mil/uL (ref 3.87–5.11)
RDW: 12.9 % (ref 11.5–15.5)
WBC: 5.1 K/uL (ref 4.0–10.5)

## 2024-07-13 LAB — COMPREHENSIVE METABOLIC PANEL WITH GFR
ALT: 64 U/L — ABNORMAL HIGH (ref 0–35)
AST: 44 U/L — ABNORMAL HIGH (ref 0–37)
Albumin: 4.4 g/dL (ref 3.5–5.2)
Alkaline Phosphatase: 72 U/L (ref 39–117)
BUN: 14 mg/dL (ref 6–23)
CO2: 23 meq/L (ref 19–32)
Calcium: 9 mg/dL (ref 8.4–10.5)
Chloride: 107 meq/L (ref 96–112)
Creatinine, Ser: 0.94 mg/dL (ref 0.40–1.20)
GFR: 76.76 mL/min (ref >60.00)
Glucose, Bld: 87 mg/dL (ref 70–99)
Potassium: 4 meq/L (ref 3.5–5.1)
Sodium: 137 meq/L (ref 135–145)
Total Bilirubin: 0.6 mg/dL (ref 0.2–1.2)
Total Protein: 6.9 g/dL (ref 6.0–8.3)

## 2024-07-13 LAB — URIC ACID: Uric Acid, Serum: 3.2 mg/dL (ref 2.4–7.0)

## 2024-07-13 LAB — GAMMA GT: GGT: 17 U/L (ref 7–51)

## 2024-07-13 LAB — FERRITIN: Ferritin: 52.7 ng/mL (ref 10.0–291.0)

## 2024-07-13 LAB — VITAMIN D 25 HYDROXY (VIT D DEFICIENCY, FRACTURES): VITD: 14.97 ng/mL — ABNORMAL LOW (ref 30.00–100.00)

## 2024-07-13 LAB — SEDIMENTATION RATE: Sed Rate: 2 mm/h (ref 0–20)

## 2024-07-13 LAB — VITAMIN B12: Vitamin B-12: 127 pg/mL — ABNORMAL LOW (ref 211–911)

## 2024-07-13 LAB — TSH: TSH: 0.9 u[IU]/mL (ref 0.35–5.50)

## 2024-07-13 LAB — IBC PANEL
Iron: 83 ug/dL (ref 42–145)
Saturation Ratios: 23.7 % (ref 20.0–50.0)
TIBC: 350 ug/dL (ref 250.0–450.0)
Transferrin: 250 mg/dL (ref 212.0–360.0)

## 2024-07-13 LAB — LIPASE: Lipase: 35 U/L (ref 11.0–59.0)

## 2024-07-13 LAB — C-REACTIVE PROTEIN: CRP: <0.5 mg/dL (ref 0.5–20.0)

## 2024-07-15 LAB — PTH, INTACT AND CALCIUM
Calcium: 10 mg/dL (ref 8.6–10.2)
PTH: 18 pg/mL (ref 16–77)

## 2024-07-17 LAB — HEPATITIS PANEL(REFL)
HEPATITIS C ANTIBODY REFILL$(REFL): NONREACTIVE
Hep B Core Total Ab: NONREACTIVE
Hep B S Ab: NONREACTIVE
Hepatitis A AB,Total: NONREACTIVE
Hepatitis B Surface Ag: NONREACTIVE

## 2024-07-17 LAB — ANGIOTENSIN CONVERTING ENZYME: Angiotensin-Converting Enzyme: 38 U/L (ref 9–67)

## 2024-07-17 LAB — ANA: Anti Nuclear Antibody (ANA): NEGATIVE

## 2024-07-17 LAB — TEST AUTHORIZATION

## 2024-07-17 LAB — HIV ANTIBODY (ROUTINE TESTING W REFLEX)
HIV 1&2 Ab, 4th Generation: NONREACTIVE
HIV FINAL INTERPRETATION: NEGATIVE

## 2024-07-17 LAB — CYCLIC CITRUL PEPTIDE ANTIBODY, IGG: Cyclic Citrullin Peptide Ab: <16 U

## 2024-07-17 LAB — CALCIUM, IONIZED: Calcium, Ion: 5.1 mg/dL (ref 4.7–5.5)

## 2024-07-17 LAB — RHEUMATOID FACTOR: Rheumatoid fact SerPl-aCnc: <10 [IU]/mL (ref <14)

## 2024-07-17 LAB — REFLEX TIQ

## 2024-07-18 ENCOUNTER — Ambulatory Visit: Payer: Self-pay | Admitting: Family Medicine

## 2024-07-18 ENCOUNTER — Other Ambulatory Visit: Payer: Self-pay

## 2024-07-18 DIAGNOSIS — F411 Generalized anxiety disorder: Secondary | ICD-10-CM | POA: Diagnosis not present

## 2024-07-18 DIAGNOSIS — F3340 Major depressive disorder, recurrent, in remission, unspecified: Secondary | ICD-10-CM | POA: Diagnosis not present

## 2024-07-18 DIAGNOSIS — Z5181 Encounter for therapeutic drug level monitoring: Secondary | ICD-10-CM | POA: Diagnosis not present

## 2024-07-18 MED ORDER — VITAMIN D (ERGOCALCIFEROL) 1.25 MG (50000 UNIT) PO CAPS: 50000.0000 [IU] | ORAL_CAPSULE | ORAL | 0 refills | Status: AC

## 2024-07-18 MED ORDER — CYANOCOBALAMIN 1000 MCG/ML IJ SOLN: 1000.0000 ug | INTRAMUSCULAR | 1 refills | Status: AC

## 2024-07-19 DIAGNOSIS — I70208 Unspecified atherosclerosis of native arteries of extremities, other extremity: Secondary | ICD-10-CM | POA: Diagnosis not present

## 2024-07-19 DIAGNOSIS — F411 Generalized anxiety disorder: Secondary | ICD-10-CM | POA: Diagnosis not present

## 2024-07-19 DIAGNOSIS — E538 Deficiency of other specified B group vitamins: Secondary | ICD-10-CM | POA: Diagnosis not present

## 2024-07-19 DIAGNOSIS — E559 Vitamin D deficiency, unspecified: Secondary | ICD-10-CM | POA: Diagnosis not present

## 2024-07-19 DIAGNOSIS — R202 Paresthesia of skin: Secondary | ICD-10-CM | POA: Diagnosis not present

## 2024-07-19 DIAGNOSIS — F3340 Major depressive disorder, recurrent, in remission, unspecified: Secondary | ICD-10-CM | POA: Diagnosis not present

## 2024-07-21 ENCOUNTER — Other Ambulatory Visit: Payer: Self-pay

## 2024-07-21 DIAGNOSIS — R200 Anesthesia of skin: Secondary | ICD-10-CM

## 2024-07-24 DIAGNOSIS — R202 Paresthesia of skin: Secondary | ICD-10-CM | POA: Diagnosis not present

## 2024-07-25 DIAGNOSIS — F840 Autistic disorder: Secondary | ICD-10-CM | POA: Diagnosis not present

## 2024-07-25 DIAGNOSIS — F3340 Major depressive disorder, recurrent, in remission, unspecified: Secondary | ICD-10-CM | POA: Diagnosis not present

## 2024-07-25 DIAGNOSIS — F411 Generalized anxiety disorder: Secondary | ICD-10-CM | POA: Diagnosis not present

## 2024-07-31 DIAGNOSIS — F411 Generalized anxiety disorder: Secondary | ICD-10-CM | POA: Diagnosis not present

## 2024-07-31 DIAGNOSIS — F3340 Major depressive disorder, recurrent, in remission, unspecified: Secondary | ICD-10-CM | POA: Diagnosis not present

## 2024-08-03 DIAGNOSIS — R202 Paresthesia of skin: Secondary | ICD-10-CM | POA: Diagnosis not present

## 2024-08-09 DIAGNOSIS — R202 Paresthesia of skin: Secondary | ICD-10-CM | POA: Diagnosis not present

## 2024-08-09 DIAGNOSIS — E538 Deficiency of other specified B group vitamins: Secondary | ICD-10-CM | POA: Diagnosis not present

## 2024-08-21 DIAGNOSIS — I7300 Raynaud's syndrome without gangrene: Secondary | ICD-10-CM | POA: Diagnosis not present

## 2024-08-29 ENCOUNTER — Other Ambulatory Visit: Payer: Self-pay | Admitting: Family Medicine
